# Patient Record
Sex: Female | Born: 1990 | Race: Black or African American | Hispanic: No | Marital: Single | Smoking: Never smoker
Health system: Southern US, Community
[De-identification: ages and names within clinical notes are randomized; demographics above are authoritative.]

## PROBLEM LIST (undated history)

## (undated) ENCOUNTER — Inpatient Hospital Stay (HOSPITAL_COMMUNITY): Payer: Self-pay

## (undated) DIAGNOSIS — O039 Complete or unspecified spontaneous abortion without complication: Secondary | ICD-10-CM

## (undated) HISTORY — PX: LEEP: SHX91

## (undated) HISTORY — PX: TONSILLECTOMY: SUR1361

## (undated) HISTORY — PX: DILATION AND CURETTAGE OF UTERUS: SHX78

---

## 2011-03-23 ENCOUNTER — Other Ambulatory Visit: Payer: Self-pay

## 2011-03-23 ENCOUNTER — Emergency Department (HOSPITAL_COMMUNITY)
Admission: EM | Admit: 2011-03-23 | Discharge: 2011-03-24 | Disposition: A | Discharge status: Home / Self Care | Payer: Self-pay | Attending: Emergency Medicine | Admitting: Emergency Medicine

## 2011-03-23 ENCOUNTER — Encounter (HOSPITAL_COMMUNITY): Payer: Self-pay | Admitting: Emergency Medicine

## 2011-03-23 DIAGNOSIS — M940 Chondrocostal junction syndrome [Tietze]: Secondary | ICD-10-CM | POA: Insufficient documentation

## 2011-03-23 DIAGNOSIS — R072 Precordial pain: Secondary | ICD-10-CM | POA: Insufficient documentation

## 2011-03-23 NOTE — ED Notes (Signed)
PT. REPORTS LEFT UPPER CHEST PAIN WITH SLIGHT SOB ONSET THIS EVENING ,PAIN WORSE WITH MOVEMENT AND CERTAIN POSITIONS , DENIES NAUSEA OR DIAPHORESIS.

## 2011-03-24 ENCOUNTER — Emergency Department (HOSPITAL_COMMUNITY): Payer: Self-pay

## 2011-03-24 MED ORDER — NAPROXEN SODIUM 220 MG PO CAPS: 2.0000 | ORAL_CAPSULE | Freq: Two times a day (BID) | ORAL | Status: DC | PRN

## 2011-03-24 MED ORDER — NAPROXEN 500 MG PO TABS
500.0000 mg | ORAL_TABLET | ORAL | Status: AC
  Administered 2011-03-24: 500 mg via ORAL
  Filled 2011-03-24: qty 1

## 2011-03-24 NOTE — ED Notes (Signed)
Pt c/o left sided chest and axilla pain that began around 11 pm this evening while lying in bed.  Denies any new physical activity or new stress in life.  Denies SOB, n/v.

## 2011-03-24 NOTE — ED Provider Notes (Signed)
History     CSN: [id number]  Arrival date & time 03/23/11  2346   First MD Initiated Contact with Patient 03/24/11 0103      Chief Complaint  Patient presents with  . Chest Pain    (Consider location/radiation/quality/duration/timing/severity/associated sxs/prior treatment) HPI Is a 21 year old black female with about a 2 hour history of left upper chest wall pain. The pain is located primarily in the left parasternal region but wraps around under the left axilla. It is worse with movement, palpation or deep breathing. She denies dyspnea stating that it only hurts to breathe. She denies recent illness, cough or chest injury. She states the pain is moderate to severe at its worst.  History reviewed. No pertinent past medical history.  History reviewed. No pertinent past surgical history.  No family history on file.  History  Substance Use Topics  . Smoking status: Never Smoker   . Smokeless tobacco: Not on file  . Alcohol Use: No    OB History    Grav Para Term Preterm Abortions TAB SAB Ect Mult Living                  Review of Systems  All other systems reviewed and are negative.    Allergies  Review of patient's allergies indicates no known allergies.  Home Medications  No current outpatient prescriptions on file.  BP 129/91  Pulse 85  Temp(Src) 98.5 F (36.9 C) (Oral)  Resp 18  SpO2 100%  LMP 03/06/2011  Physical Exam General: Well-developed, well-nourished female in no acute distress; appearance consistent with age of record HENT: normocephalic, atraumatic Eyes: Normal Neck: supple Heart: regular rate and rhythm Lungs: clear to auscultation bilaterally Chest: Moderate left parasternal, subpectoral and subaxillary rib tenderness; pain is reproduced on movement of the shoulder Abdomen: soft; nondistended Extremities: No deformity; full range of motion Neurologic: Awake, alert and oriented; motor function intact in all extremities and symmetric; no  facial droop Skin: Warm and dry Psychiatric: Normal mood and affect    ED Course  Procedures (including critical care time)    MDM  EKG Interpretation:  Date & Time: 03/24/2011 11:58 PM  Rate: 98  Rhythm: normal sinus rhythm  QRS Axis: normal  Intervals: normal  ST/T Wave abnormalities: normal  Conduction Disutrbances:none  Narrative Interpretation:   Old EKG Reviewed: none available  Nursing notes and vitals signs, including pulse oximetry, reviewed.  Summary of this visit's results, reviewed by myself:  Labs:  No results found for this or any previous visit.  Imaging Studies: Dg Chest 2 View  03/24/2011  *RADIOLOGY REPORT*  Clinical Data: Left chest and arm pain  CHEST - 2 VIEW  Comparison: None  Findings: Normal heart size, mediastinal contours, and pulmonary vascularity. Lungs clear. No pleural effusion or pneumothorax. Jewelry artifacts at the breast bilaterally. Mild biconvex thoracolumbar scoliosis.  IMPRESSION: No acute abnormalities.  Original Report Authenticated By: Lollie Marrow, M.D.               Hanley Seamen, MD 03/24/11 [phone]

## 2011-03-24 NOTE — ED Notes (Signed)
rx x 1, pt voiced understanding to f/u with PCP and return for worsening of sx. 

## 2012-06-27 ENCOUNTER — Ambulatory Visit: Payer: Self-pay | Admitting: Obstetrics

## 2012-07-25 ENCOUNTER — Ambulatory Visit: Payer: Self-pay | Admitting: Obstetrics

## 2013-02-12 IMAGING — CR DG CHEST 2V
2 series · 2 of 2 positions shown · non-contrast
Comparison: None

CLINICAL DATA: Left chest and arm pain

CHEST - 2 VIEW

[w chest pa *]
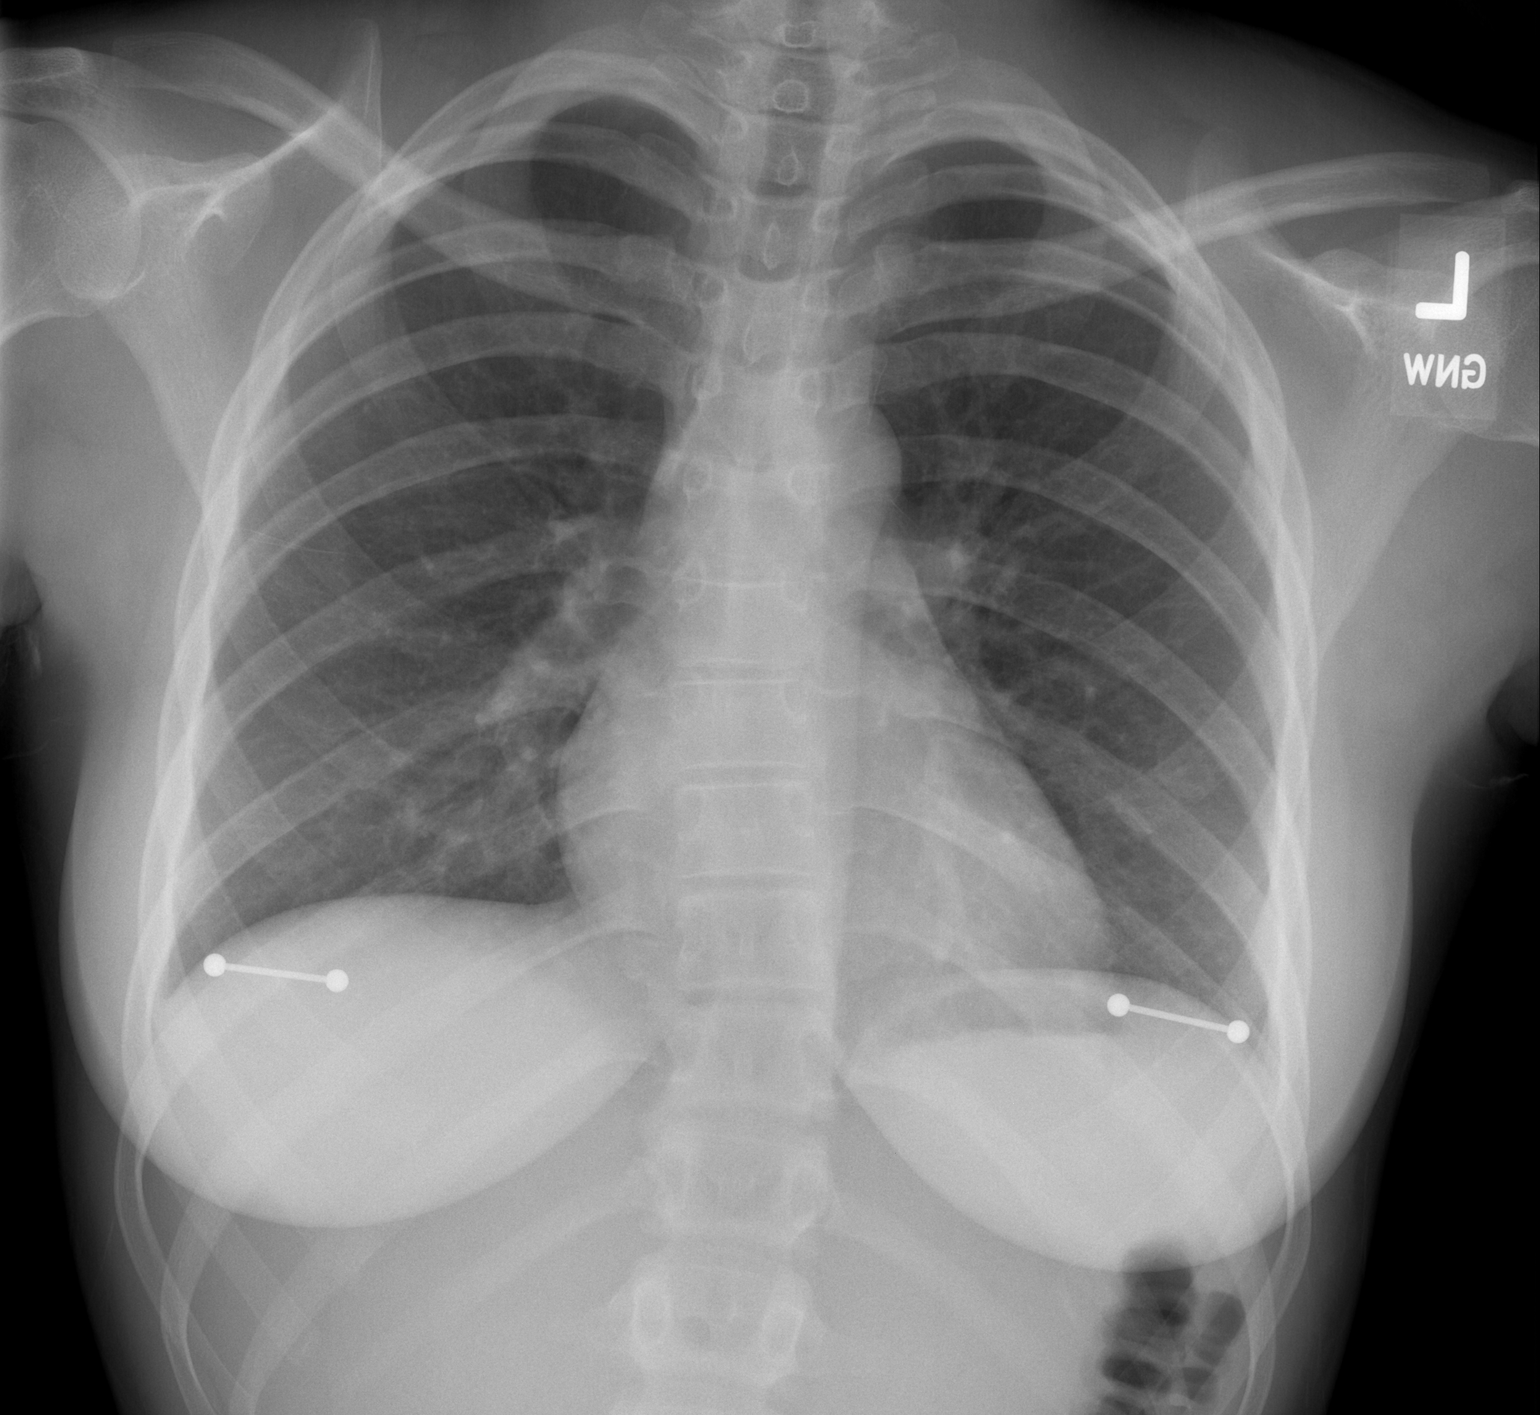

[w chest lat *]
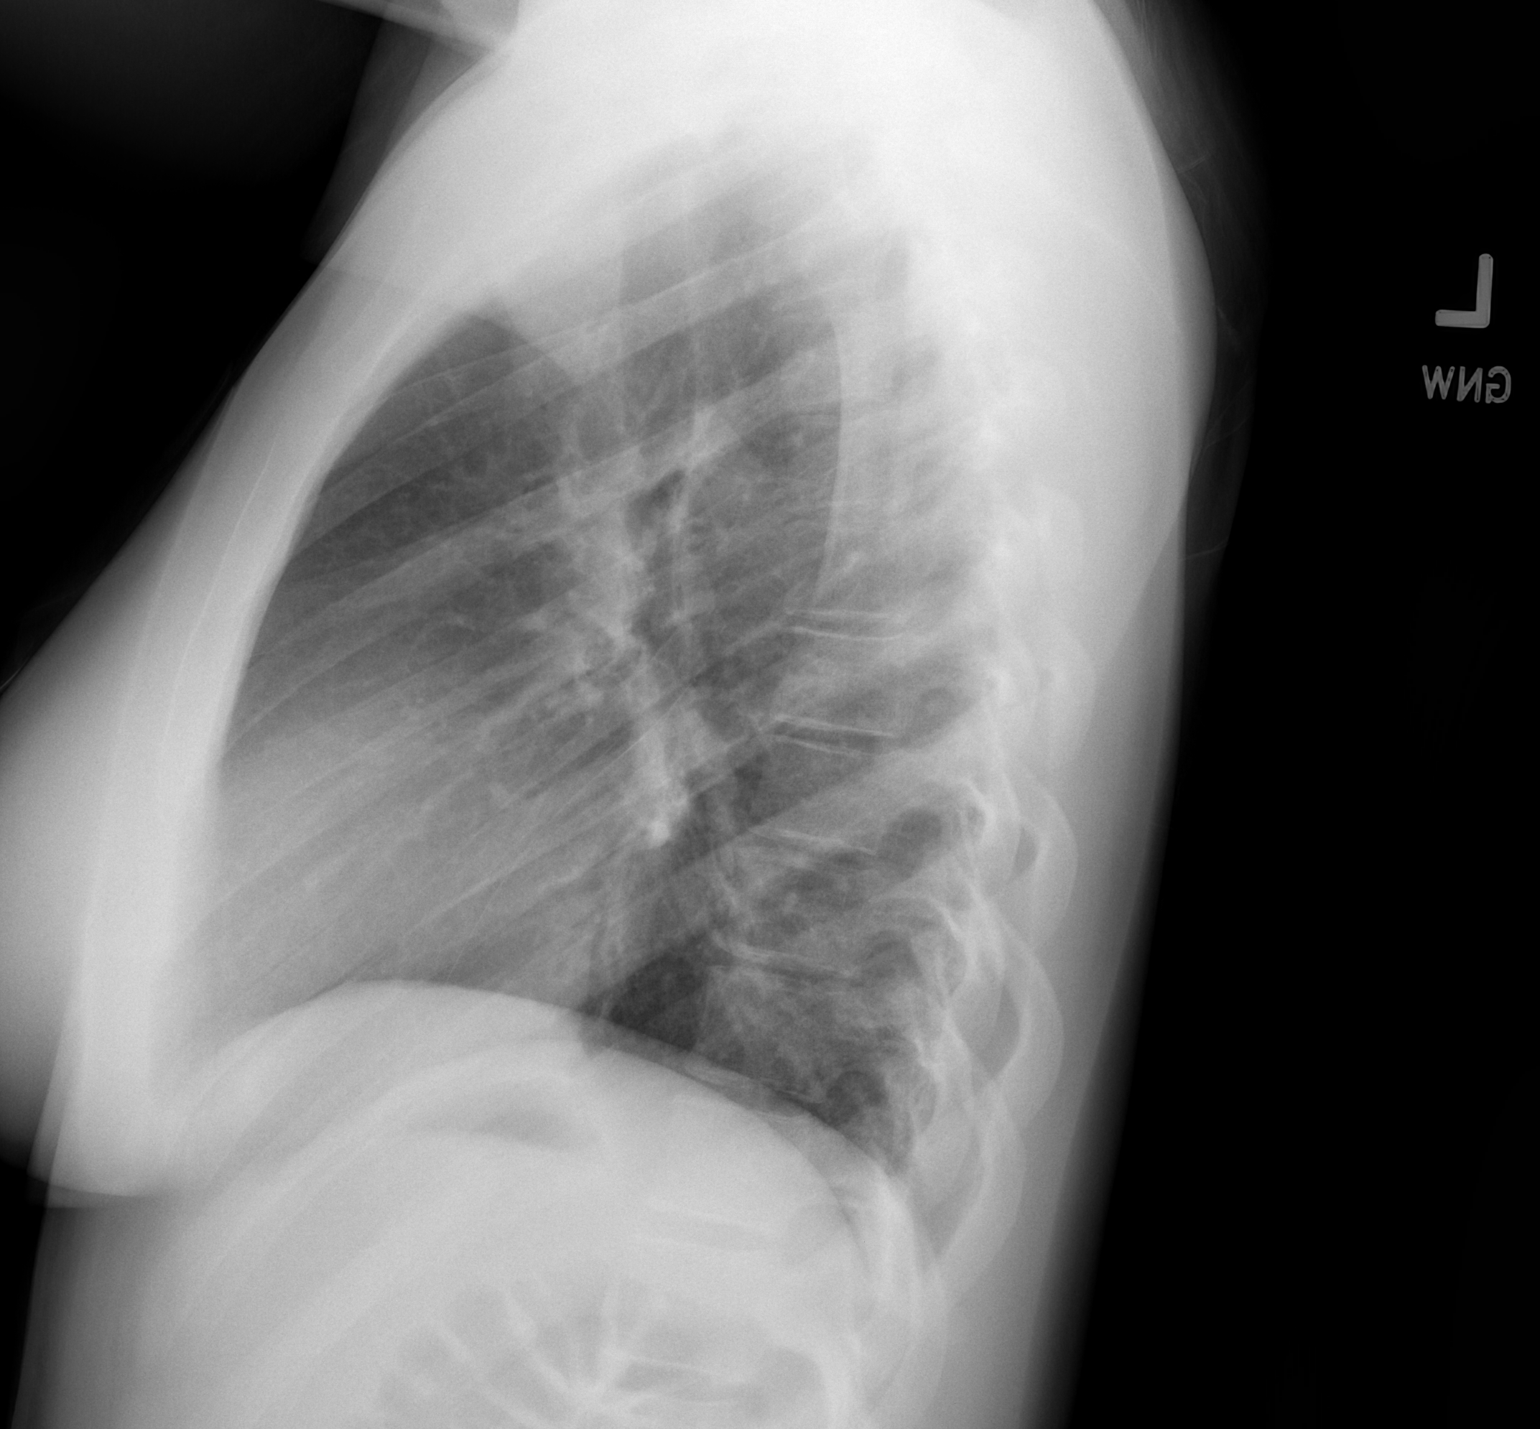

[2 of 2 positions shown; findings below may reference images not displayed]

FINDINGS: Normal heart size, mediastinal contours, and pulmonary vascularity.
Lungs clear.
No pleural effusion or pneumothorax.
Jewelry artifacts at the breast bilaterally.
Mild biconvex thoracolumbar scoliosis.
IMPRESSION: No acute abnormalities.

## 2014-09-18 ENCOUNTER — Emergency Department (HOSPITAL_COMMUNITY)
Admission: EM | Admit: 2014-09-18 | Discharge: 2014-09-18 | Disposition: A | Discharge status: Home / Self Care | Payer: BLUE CROSS/BLUE SHIELD | Attending: Emergency Medicine | Admitting: Emergency Medicine

## 2014-09-18 ENCOUNTER — Encounter (HOSPITAL_COMMUNITY): Payer: Self-pay | Admitting: *Deleted

## 2014-09-18 DIAGNOSIS — O9989 Other specified diseases and conditions complicating pregnancy, childbirth and the puerperium: Secondary | ICD-10-CM | POA: Diagnosis not present

## 2014-09-18 DIAGNOSIS — Z3491 Encounter for supervision of normal pregnancy, unspecified, first trimester: Secondary | ICD-10-CM

## 2014-09-18 DIAGNOSIS — R102 Pelvic and perineal pain: Secondary | ICD-10-CM | POA: Diagnosis not present

## 2014-09-18 DIAGNOSIS — Z3A09 9 weeks gestation of pregnancy: Secondary | ICD-10-CM | POA: Diagnosis not present

## 2014-09-18 DIAGNOSIS — N949 Unspecified condition associated with female genital organs and menstrual cycle: Secondary | ICD-10-CM

## 2014-09-18 LAB — URINALYSIS, ROUTINE W REFLEX MICROSCOPIC
BILIRUBIN URINE: NEGATIVE
GLUCOSE, UA: NEGATIVE mg/dL
Hgb urine dipstick: NEGATIVE
Ketones, ur: NEGATIVE mg/dL
Nitrite: NEGATIVE
PH: 7 (ref 5.0–8.0)
Protein, ur: NEGATIVE mg/dL
SPECIFIC GRAVITY, URINE: 1.015 (ref 1.005–1.030)
UROBILINOGEN UA: 0.2 mg/dL (ref 0.0–1.0)

## 2014-09-18 LAB — COMPREHENSIVE METABOLIC PANEL
ALT: 17 U/L (ref 14–54)
AST: 21 U/L (ref 15–41)
Albumin: 3.9 g/dL (ref 3.5–5.0)
Alkaline Phosphatase: 27 U/L — ABNORMAL LOW (ref 38–126)
Anion gap: 7 (ref 5–15)
BILIRUBIN TOTAL: 0.5 mg/dL (ref 0.3–1.2)
BUN: 8 mg/dL (ref 6–20)
CALCIUM: 9 mg/dL (ref 8.9–10.3)
CO2: 22 mmol/L (ref 22–32)
CREATININE: 0.53 mg/dL (ref 0.44–1.00)
Chloride: 102 mmol/L (ref 101–111)
Glucose, Bld: 92 mg/dL (ref 65–99)
Potassium: 3.7 mmol/L (ref 3.5–5.1)
Sodium: 131 mmol/L — ABNORMAL LOW (ref 135–145)
TOTAL PROTEIN: 6.5 g/dL (ref 6.5–8.1)

## 2014-09-18 LAB — CBC
HCT: 34.2 % — ABNORMAL LOW (ref 36.0–46.0)
HEMOGLOBIN: 11.9 g/dL — AB (ref 12.0–15.0)
MCH: 28.9 pg (ref 26.0–34.0)
MCHC: 34.8 g/dL (ref 30.0–36.0)
MCV: 83 fL (ref 78.0–100.0)
Platelets: 249 10*3/uL (ref 150–400)
RBC: 4.12 MIL/uL (ref 3.87–5.11)
RDW: 12.9 % (ref 11.5–15.5)
WBC: 8.4 10*3/uL (ref 4.0–10.5)

## 2014-09-18 LAB — WET PREP, GENITAL
Trich, Wet Prep: NONE SEEN
Yeast Wet Prep HPF POC: NONE SEEN

## 2014-09-18 LAB — URINE MICROSCOPIC-ADD ON

## 2014-09-18 LAB — ABO/RH: ABO/RH(D): B POS

## 2014-09-18 LAB — GC/CHLAMYDIA PROBE AMP (~~LOC~~) NOT AT ARMC
CHLAMYDIA, DNA PROBE: POSITIVE — AB
NEISSERIA GONORRHEA: NEGATIVE

## 2014-09-18 LAB — HIV ANTIBODY (ROUTINE TESTING W REFLEX): HIV SCREEN 4TH GENERATION: NONREACTIVE

## 2014-09-18 LAB — I-STAT BETA HCG BLOOD, ED (MC, WL, AP ONLY): I-stat hCG, quantitative: >2000 m[IU]/mL — ABNORMAL HIGH (ref <5)

## 2014-09-18 LAB — HCG, QUANTITATIVE, PREGNANCY: HCG, BETA CHAIN, QUANT, S: 127066 m[IU]/mL — AB (ref <5)

## 2014-09-18 LAB — LIPASE, BLOOD: LIPASE: 15 U/L — AB (ref 22–51)

## 2014-09-18 LAB — RPR: RPR Ser Ql: NONREACTIVE

## 2014-09-18 MED ORDER — ACETAMINOPHEN 325 MG PO TABS
650.0000 mg | ORAL_TABLET | Freq: Once | ORAL | Status: AC
  Administered 2014-09-18: 650 mg via ORAL
  Filled 2014-09-18: qty 2

## 2014-09-18 MED ORDER — PRENATAL COMPLETE 14-0.4 MG PO TABS: 1.0000 | ORAL_TABLET | Freq: Every day | ORAL | Status: DC

## 2014-09-18 NOTE — ED Provider Notes (Signed)
CSN: [id number]     Arrival date & time 09/18/14  0238 History   First MD Initiated Contact with Patient 09/18/14 0435     Chief Complaint  Patient presents with  . Abdominal Pain     (Consider location/radiation/quality/duration/timing/severity/associated sxs/prior Treatment) Patient is a 24 y.o. female presenting with abdominal pain. The history is provided by the patient.  Abdominal Pain She is about [redacted] weeks pregnant with last menses 8/23. She was awakened at 1 AM with crampy suprapubic pain which he rates at 7/10. It is worse with certain movements and better she lays still. She has not started prenatal care and is not on any prenatal vitamins. She is gravida 3, para 1 with 2 voluntary since her pregnancy. She has made arrangements for prenatal care. There is been no bleeding and no vaginal discharge. She has not taken any medication for her pain.  History reviewed. No pertinent past medical history. History reviewed. No pertinent past surgical history. History reviewed. No pertinent family history. History  Substance Use Topics  . Smoking status: Never Smoker   . Smokeless tobacco: Not on file  . Alcohol Use: No   OB History    Gravida Para Term Preterm AB TAB SAB Ectopic Multiple Living   1              Review of Systems  Gastrointestinal: Positive for abdominal pain.  All other systems reviewed and are negative.     Allergies  Review of patient's allergies indicates no known allergies.  Home Medications   Prior to Admission medications   Medication Sig Start Date End Date Taking? Authorizing Provider  Naproxen Sodium 220 MG CAPS Take 2 capsules (440 mg total) by mouth every 12 (twelve) hours as needed. 03/24/11   John Molpus, MD   BP 137/82 mmHg  Pulse 102  Temp(Src) 98.2 F (36.8 C) (Oral)  Resp 16  Ht  (1.549 m)  Wt 108 lb 4.8 oz (49.125 kg)  BMI 20.47 kg/m2  SpO2 99%  LMP 07/13/2014 Physical Exam  Nursing note and vitals reviewed.  24 year old  female, resting comfortably and in no acute distress. Vital signs are significant for borderline tachycardia. Oxygen saturation is 99%, which is normal. Head is normocephalic and atraumatic. PERRLA, EOMI. Oropharynx is clear. Neck is nontender and supple without adenopathy or JVD. Back is nontender and there is no CVA tenderness. Lungs are clear without rales, wheezes, or rhonchi. Chest is nontender. Heart has regular rate and rhythm without murmur. Abdomen is soft, flat, with moderate midline suprapubic tenderness. There is no rebound or guarding. There are no masses or hepatosplenomegaly and peristalsis is normoactive. Pelvic: Normal external female genitalia. Moderate amount of yellowish discharge present. Cervix is closed without bleeding. This is a to 10 weeks size. Pain is reproduced when uterus is distracted to either side consistent with round ligament pain. No adnexal masses or tenderness. Extremities have no cyanosis or edema, full range of motion is present. Skin is warm and dry without rash. Neurologic: Mental status is normal, cranial nerves are intact, there are no motor or sensory deficits.  ED Course  Procedures (including critical care time) Labs Review Results for orders placed or performed during the hospital encounter of 09/18/14  Wet prep, genital  Result Value Ref Range   Yeast Wet Prep HPF POC NONE SEEN NONE SEEN   Trich, Wet Prep NONE SEEN NONE SEEN   Clue Cells Wet Prep HPF POC MODERATE (A) NONE SEEN  WBC, Wet Prep HPF POC FEW (A) NONE SEEN  Lipase, blood  Result Value Ref Range   Lipase 15 (L) 22 - 51 U/L  Comprehensive metabolic panel  Result Value Ref Range   Sodium 131 (L) 135 - 145 mmol/L   Potassium 3.7 3.5 - 5.1 mmol/L   Chloride 102 101 - 111 mmol/L   CO2 22 22 - 32 mmol/L   Glucose, Bld 92 65 - 99 mg/dL   BUN 8 6 - 20 mg/dL   Creatinine, Ser 9.14 0.44 - 1.00 mg/dL   Calcium 9.0 8.9 - 78.2 mg/dL   Total Protein 6.5 6.5 - 8.1 g/dL   Albumin 3.9  3.5 - 5.0 g/dL   AST 21 15 - 41 U/L   ALT 17 14 - 54 U/L   Alkaline Phosphatase 27 (L) 38 - 126 U/L   Total Bilirubin 0.5 0.3 - 1.2 mg/dL   GFR calc non Af Amer >60 >60 mL/min   GFR calc Af Amer >60 >60 mL/min   Anion gap 7 5 - 15  CBC  Result Value Ref Range   WBC 8.4 4.0 - 10.5 K/uL   RBC 4.12 3.87 - 5.11 MIL/uL   Hemoglobin 11.9 (L) 12.0 - 15.0 g/dL   HCT 95.6 (L) 21.3 - 08.6 %   MCV 83.0 78.0 - 100.0 fL   MCH 28.9 26.0 - 34.0 pg   MCHC 34.8 30.0 - 36.0 g/dL   RDW 57.8 46.9 - 62.9 %   Platelets 249 150 - 400 K/uL  Urinalysis, Routine w reflex microscopic (not at [facility])  Result Value Ref Range   Color, Urine YELLOW YELLOW   APPearance CLEAR CLEAR   Specific Gravity, Urine 1.015 1.005 - 1.030   pH 7.0 5.0 - 8.0   Glucose, UA NEGATIVE NEGATIVE mg/dL   Hgb urine dipstick NEGATIVE NEGATIVE   Bilirubin Urine NEGATIVE NEGATIVE   Ketones, ur NEGATIVE NEGATIVE mg/dL   Protein, ur NEGATIVE NEGATIVE mg/dL   Urobilinogen, UA 0.2 0.0 - 1.0 mg/dL   Nitrite NEGATIVE NEGATIVE   Leukocytes, UA TRACE (A) NEGATIVE  Urine microscopic-add on  Result Value Ref Range   Squamous Epithelial / LPF FEW (A) RARE   WBC, UA 3-6 <3 WBC/hpf   Bacteria, UA FEW (A) RARE  hCG, quantitative, pregnancy  Result Value Ref Range   hCG, Beta Chain, Quant, S 528413 (H) <5 mIU/mL  I-Stat beta hCG blood, ED ([facility], [facility], [facility] only)  Result Value Ref Range   I-stat hCG, quantitative >2000.0 (H) <5 mIU/mL   Comment 3          ABO/Rh  Result Value Ref Range   ABO/RH(D) B POS    No rh immune globuloin NOT A RH IMMUNE GLOBULIN CANDIDATE, PT RH POSITIVE     MDM   Final diagnoses:  Round ligament pain  First trimester pregnancy    First trimester pregnancy with pelvic pain - most likely round ligament pain. Old records are reviewed and there are no relevant past visits.  Laboratory workup is unremarkable. Patient is given reassurance about the nature of her pain. She is not on prenatal vitamins, so prescription  is given for them. She is encouraged to start prenatal care as soon as possible.  Dione Booze, MD 09/18/14 [phone]

## 2014-09-18 NOTE — Discharge Instructions (Signed)
Apply heat, take acetaminophen for pain. Do not take ibuprofen or ibuprofen while you are pregnant.   First Trimester of Pregnancy The first trimester of pregnancy is from week 1 until the end of week 12 (months 1 through 3). A week after a sperm fertilizes an egg, the egg will implant on the wall of the uterus. This embryo will begin to develop into a baby. Genes from you and your partner are forming the baby. The female genes determine whether the baby is a boy or a girl. At 6-8 weeks, the eyes and face are formed, and the heartbeat can be seen on ultrasound. At the end of 12 weeks, all the baby's organs are formed.  Now that you are pregnant, you will want to do everything you can to have a healthy baby. Two of the most important things are to get good prenatal care and to follow your health care provider's instructions. Prenatal care is all the medical care you receive before the baby's birth. This care will help prevent, find, and treat any problems during the pregnancy and childbirth. BODY CHANGES Your body goes through many changes during pregnancy. The changes vary from woman to woman.   You may gain or lose a couple of pounds at first.  You may feel sick to your stomach (nauseous) and throw up (vomit). If the vomiting is uncontrollable, call your health care provider.  You may tire easily.  You may develop headaches that can be relieved by medicines approved by your health care provider.  You may urinate more often. Painful urination may mean you have a bladder infection.  You may develop heartburn as a result of your pregnancy.  You may develop constipation because certain hormones are causing the muscles that push waste through your intestines to slow down.  You may develop hemorrhoids or swollen, bulging veins (varicose veins).  Your breasts may begin to grow larger and become tender. Your nipples may stick out more, and the tissue that surrounds them (areola) may become  darker.  Your gums may bleed and may be sensitive to brushing and flossing.  Dark spots or blotches (chloasma, mask of pregnancy) may develop on your face. This will likely fade after the baby is born.  Your menstrual periods will stop.  You may have a loss of appetite.  You may develop cravings for certain kinds of food.  You may have changes in your emotions from day to day, such as being excited to be pregnant or being concerned that something may go wrong with the pregnancy and baby.  You may have more vivid and strange dreams.  You may have changes in your hair. These can include thickening of your hair, rapid growth, and changes in texture. Some women also have hair loss during or after pregnancy, or hair that feels dry or thin. Your hair will most likely return to normal after your baby is born. WHAT TO EXPECT AT YOUR PRENATAL VISITS During a routine prenatal visit:  You will be weighed to make sure you and the baby are growing normally.  Your blood pressure will be taken.  Your abdomen will be measured to track your baby's growth.  The fetal heartbeat will be listened to starting around week 10 or 12 of your pregnancy.  Test results from any previous visits will be discussed. Your health care provider may ask you:  How you are feeling.  If you are feeling the baby move.  If you have had any abnormal symptoms,  such as leaking fluid, bleeding, severe headaches, or abdominal cramping.  If you have any questions. Other tests that may be performed during your first trimester include:  Blood tests to find your blood type and to check for the presence of any previous infections. They will also be used to check for low iron levels (anemia) and Rh antibodies. Later in the pregnancy, blood tests for diabetes will be done along with other tests if problems develop.  Urine tests to check for infections, diabetes, or protein in the urine.  An ultrasound to confirm the proper  growth and development of the baby.  An amniocentesis to check for possible genetic problems.  Fetal screens for spina bifida and Down syndrome.  You may need other tests to make sure you and the baby are doing well. HOME CARE INSTRUCTIONS  Medicines  Follow your health care provider's instructions regarding medicine use. Specific medicines may be either safe or unsafe to take during pregnancy.  Take your prenatal vitamins as directed.  If you develop constipation, try taking a stool softener if your health care provider approves. Diet  Eat regular, well-balanced meals. Choose a variety of foods, such as meat or vegetable-based protein, fish, milk and low-fat dairy products, vegetables, fruits, and whole grain breads and cereals. Your health care provider will help you determine the amount of weight gain that is right for you.  Avoid raw meat and uncooked cheese. These carry germs that can cause birth defects in the baby.  Eating four or five small meals rather than three large meals a day may help relieve nausea and vomiting. If you start to feel nauseous, eating a few soda crackers can be helpful. Drinking liquids between meals instead of during meals also seems to help nausea and vomiting.  If you develop constipation, eat more high-fiber foods, such as fresh vegetables or fruit and whole grains. Drink enough fluids to keep your urine clear or pale yellow. Activity and Exercise  Exercise only as directed by your health care provider. Exercising will help you:  Control your weight.  Stay in shape.  Be prepared for labor and delivery.  Experiencing pain or cramping in the lower abdomen or low back is a good sign that you should stop exercising. Check with your health care provider before continuing normal exercises.  Try to avoid standing for long periods of time. Move your legs often if you must stand in one place for a long time.  Avoid heavy lifting.  Wear low-heeled  shoes, and practice good posture.  You may continue to have sex unless your health care provider directs you otherwise. Relief of Pain or Discomfort  Wear a good support bra for breast tenderness.   Take warm sitz baths to soothe any pain or discomfort caused by hemorrhoids. Use hemorrhoid cream if your health care provider approves.   Rest with your legs elevated if you have leg cramps or low back pain.  If you develop varicose veins in your legs, wear support hose. Elevate your feet for 15 minutes, 3-4 times a day. Limit salt in your diet. Prenatal Care  Schedule your prenatal visits by the twelfth week of pregnancy. They are usually scheduled monthly at first, then more often in the last 2 months before delivery.  Write down your questions. Take them to your prenatal visits.  Keep all your prenatal visits as directed by your health care provider. Safety  Wear your seat belt at all times when driving.  Make a list  of emergency phone numbers, including numbers for family, friends, the hospital, and police and fire departments. General Tips  Ask your health care provider for a referral to a local prenatal education class. Begin classes no later than at the beginning of month 6 of your pregnancy.  Ask for help if you have counseling or nutritional needs during pregnancy. Your health care provider can offer advice or refer you to specialists for help with various needs.  Do not use hot tubs, steam rooms, or saunas.  Do not douche or use tampons or scented sanitary pads.  Do not cross your legs for long periods of time.  Avoid cat litter boxes and soil used by cats. These carry germs that can cause birth defects in the baby and possibly loss of the fetus by miscarriage or stillbirth.  Avoid all smoking, herbs, alcohol, and medicines not prescribed by your health care provider. Chemicals in these affect the formation and growth of the baby.  Schedule a dentist appointment. At  home, brush your teeth with a soft toothbrush and be gentle when you floss. SEEK MEDICAL CARE IF:   You have dizziness.  You have mild pelvic cramps, pelvic pressure, or nagging pain in the abdominal area.  You have persistent nausea, vomiting, or diarrhea.  You have a bad smelling vaginal discharge.  You have pain with urination.  You notice increased swelling in your face, hands, legs, or ankles. SEEK IMMEDIATE MEDICAL CARE IF:   You have a fever.  You are leaking fluid from your vagina.  You have spotting or bleeding from your vagina.  You have severe abdominal cramping or pain.  You have rapid weight gain or loss.  You vomit blood or material that looks like coffee grounds.  You are exposed to [place] measles and have never had them.  You are exposed to fifth disease or chickenpox.  You develop a severe headache.  You have shortness of breath.  You have any kind of trauma, such as from a fall or a car accident. Document Released: 01/31/2001 Document Revised: 06/23/2013 Document Reviewed: 12/17/2012 [facility] Patient Information 2015 [place], [place]. This information is not intended to replace advice given to you by your health care provider. Make sure you discuss any questions you have with your health care provider.

## 2014-09-18 NOTE — ED Notes (Signed)
Pelvic cart setup bedside. 

## 2014-09-18 NOTE — ED Notes (Signed)
Pt in c/o lower abd pain that started about two hours ago, pt states she is approx [redacted] weeks pregnant, denies vaginal bleeding, no distress noted

## 2014-09-21 ENCOUNTER — Telehealth (HOSPITAL_COMMUNITY): Payer: Self-pay

## 2014-09-21 NOTE — Telephone Encounter (Signed)
Positive for chlamydia. Chart sent to edp office for review 

## 2014-09-23 ENCOUNTER — Telehealth (HOSPITAL_COMMUNITY): Payer: Self-pay

## 2014-09-23 NOTE — Telephone Encounter (Signed)
Chart reviewed by Dr Silverio Lay "Azithromycin 1 gm po x 1"  Partner needs treatment 09/23/14  13:44 LVM requesting callback.

## 2014-09-24 ENCOUNTER — Telehealth (HOSPITAL_COMMUNITY): Payer: Self-pay

## 2014-09-26 ENCOUNTER — Telehealth (HOSPITAL_COMMUNITY): Payer: Self-pay | Admitting: Emergency Medicine

## 2014-09-26 NOTE — Telephone Encounter (Signed)
Unable to contact patient by phone regarding lab results after multiple attempts, letter sent.

## 2014-09-26 NOTE — Telephone Encounter (Signed)
Post ED Visit - Positive Culture Follow-up: Successful Patient Follow-Up  Positive Chlamydia culture   Patient discharged without antimicrobial prescription and treatment is now indicated  Organism is resistant to prescribed ED discharge antimicrobial  Patient with positive blood cultures  Changes discussed with ED provider: Dr Chaney Malling New antibiotic prescription: Azithromycin one gram PO x once Called to [facility]  [phone]  Contacted patient, date 09/26/14, time 1640 ID verified, patient notified of positive Chlamydia and need for treatment. STD instructions provided, patient verbalized understanding. RX called to [facility] [phone]  Monica Shields 09/26/2014, 4:42 PM

## 2016-03-25 ENCOUNTER — Encounter (HOSPITAL_COMMUNITY): Payer: Self-pay

## 2016-03-25 ENCOUNTER — Emergency Department (HOSPITAL_COMMUNITY): Payer: Medicaid Other

## 2016-03-25 ENCOUNTER — Emergency Department (HOSPITAL_COMMUNITY)
Admission: EM | Admit: 2016-03-25 | Discharge: 2016-03-26 | Disposition: A | Discharge status: Home / Self Care | Payer: Medicaid Other | Attending: Emergency Medicine | Admitting: Emergency Medicine

## 2016-03-25 DIAGNOSIS — O200 Threatened abortion: Secondary | ICD-10-CM

## 2016-03-25 DIAGNOSIS — Z3A01 Less than 8 weeks gestation of pregnancy: Secondary | ICD-10-CM | POA: Diagnosis not present

## 2016-03-25 DIAGNOSIS — O209 Hemorrhage in early pregnancy, unspecified: Secondary | ICD-10-CM | POA: Diagnosis present

## 2016-03-25 DIAGNOSIS — N939 Abnormal uterine and vaginal bleeding, unspecified: Secondary | ICD-10-CM

## 2016-03-25 LAB — URINALYSIS, ROUTINE W REFLEX MICROSCOPIC
BILIRUBIN URINE: NEGATIVE
Glucose, UA: NEGATIVE mg/dL
HGB URINE DIPSTICK: NEGATIVE
Ketones, ur: 5 mg/dL — AB
Leukocytes, UA: NEGATIVE
NITRITE: NEGATIVE
PH: 5 (ref 5.0–8.0)
Protein, ur: NEGATIVE mg/dL
SPECIFIC GRAVITY, URINE: 1.025 (ref 1.005–1.030)

## 2016-03-25 LAB — I-STAT BETA HCG BLOOD, ED (MC, WL, AP ONLY): I-stat hCG, quantitative: >2000 m[IU]/mL — ABNORMAL HIGH (ref <5)

## 2016-03-25 LAB — WET PREP, GENITAL
Sperm: NONE SEEN
Trich, Wet Prep: NONE SEEN
Yeast Wet Prep HPF POC: NONE SEEN

## 2016-03-25 LAB — HCG, QUANTITATIVE, PREGNANCY: HCG, BETA CHAIN, QUANT, S: 16356 m[IU]/mL — AB (ref <5)

## 2016-03-25 NOTE — ED Notes (Signed)
Pt understood dc material. NAD noted. 

## 2016-03-25 NOTE — ED Triage Notes (Signed)
Estimated [redacted] weeks pregnant.  Pt had u/s done 03-21-16.  Last intercourse 2 days ago.  Onset yesterday light pink blood noted when wiping.  Has wiped x 10 today.  No abd cramping.

## 2016-03-25 NOTE — ED Provider Notes (Signed)
[facility]-EMERGENCY DEPT Provider Note   CSN: [id number] Arrival date & time: 03/25/16  1854     History   Chief Complaint Chief Complaint  Patient presents with  . Vaginal Bleeding    HPI Monica Shields is a 26 y.o. female.  The history is provided by the patient. No language interpreter was used.  Vaginal Bleeding  Primary symptoms include vaginal bleeding.    Monica Shields is a 26 y.o. female who presents to the Emergency Department complaining of vaginal bleeding.  She reports spotting since yesterday, initially light pink and now a little more red.  Bleeding present when she wipes and after using the bathroom.  No pain.  Denies fevers, N/V/dysuria.  She denies any medical problems.  First day of LMP 01/15/16.  She saw the doctor on Tuesday and was measured at 6 wks with planned follow up US in one week.  She is followed by [facility] in [place].  She has a hx/o prior miscarraige at 8-9 wks.  On record review her blood type is Bpos  History reviewed. No pertinent past medical history.  There are no active problems to display for this patient.   History reviewed. No pertinent surgical history.  OB History    Gravida Para Term Preterm AB Living   1             SAB TAB Ectopic Multiple Live Births                   Home Medications    Prior to Admission medications   Medication Sig Start Date End Date Taking? Authorizing Provider  Naproxen Sodium 220 MG CAPS Take 2 capsules (440 mg total) by mouth every 12 (twelve) hours as needed. Patient not taking: Reported on 09/18/2014 03/24/11   Paula Libra, MD  Prenatal Vit-Fe Fumarate-FA (PRENATAL COMPLETE) 14-0.4 MG TABS Take 1 tablet by mouth daily. 09/18/14   Dione Booze, MD    Family History History reviewed. No pertinent family history.  Social History Social History  Substance Use Topics  . Smoking status: Never Smoker  . Smokeless tobacco: Never Used  . Alcohol use No     Allergies   Patient has no  known allergies.   Review of Systems Review of Systems  Genitourinary: Positive for vaginal bleeding.  All other systems reviewed and are negative.    Physical Exam Updated Vital Signs BP 116/72 (BP Location: Right Arm)   Pulse 95   Temp 98.7 F (37.1 C) (Oral)   Resp 16   LMP 01/15/2016 Comment: LMP date is not correct, pt reports u/s done week of 1-30 and was told [redacted] weeks pregnant.     SpO2 100%   Physical Exam  Constitutional: She is oriented to person, place, and time. She appears well-developed and well-nourished.  HENT:  Head: Normocephalic and atraumatic.  Cardiovascular: Normal rate and regular rhythm.   No murmur heard. Pulmonary/Chest: Effort normal and breath sounds normal. No respiratory distress.  Abdominal: Soft. There is no tenderness. There is no rebound and no guarding.  Genitourinary:  Genitourinary Comments: Os open with scant vaginal bleeding.  No CMT or adnexal tenderness  Musculoskeletal: She exhibits no edema or tenderness.  Neurological: She is alert and oriented to person, place, and time.  Skin: Skin is warm and dry.  Psychiatric: She has a normal mood and affect. Her behavior is normal.  Nursing note and vitals reviewed.    ED Treatments / Results  Labs (all  labs ordered are listed, but only abnormal results are displayed) Labs Reviewed  WET PREP, GENITAL - Abnormal; Notable for the following:       Result Value   Clue Cells Wet Prep HPF POC PRESENT (*)    WBC, Wet Prep HPF POC FEW (*)    All other components within normal limits  HCG, QUANTITATIVE, PREGNANCY - Abnormal; Notable for the following:    hCG, Beta Chain, Quant, S 16,356 (*)    All other components within normal limits  URINALYSIS, ROUTINE W REFLEX MICROSCOPIC - Abnormal; Notable for the following:    APPearance HAZY (*)    Ketones, ur 5 (*)    All other components within normal limits  I-STAT BETA HCG BLOOD, ED ([facility], [facility], [facility] ONLY) - Abnormal; Notable for the following:     I-stat hCG, quantitative >2,000.0 (*)    All other components within normal limits  GC/CHLAMYDIA PROBE AMP ([place]) NOT AT [facility]    EKG  EKG Interpretation None       Radiology US Ob Comp < 14 Wks  Result Date: 03/25/2016 CLINICAL DATA:  Bleeding for 1 day. EXAM: OBSTETRIC <14 WK [place] AND TRANSVAGINAL OB US TECHNIQUE: Both transabdominal and transvaginal ultrasound examinations were performed for complete evaluation of the gestation as well as the maternal uterus, adnexal regions, and pelvic cul-de-sac. Transvaginal technique was performed to assess early pregnancy. COMPARISON:  None. FINDINGS: Intrauterine gestational sac: Single Yolk sac:  Present Embryo:  Not seen Cardiac Activity: Not seen Heart Rate: Not applicable MSD: 25  mm   7 w   4  d CRL:    mm    w    d                  [place] EDC: Subchorionic hemorrhage:  None visualized. Maternal uterus/adnexae: Maternal right ovary appears normal. Left ovary is not seen but there is no mass or free fluid identified in the left adnexal region. IMPRESSION: Single intrauterine gestational sac. No embryo identified and there is complex fluid within the gestational sac, highly suspicious for pregnancy failure. Recommend follow-up with serial beta HCG levels and pelvic ultrasound as needed. Electronically Signed   By: Bary Richard M.D.   On: 03/25/2016 22:20   US Ob Transvaginal  Result Date: 03/25/2016 CLINICAL DATA:  Bleeding for 1 day. EXAM: OBSTETRIC <14 WK [place] AND TRANSVAGINAL OB US TECHNIQUE: Both transabdominal and transvaginal ultrasound examinations were performed for complete evaluation of the gestation as well as the maternal uterus, adnexal regions, and pelvic cul-de-sac. Transvaginal technique was performed to assess early pregnancy. COMPARISON:  None. FINDINGS: Intrauterine gestational sac: Single Yolk sac:  Present Embryo:  Not seen Cardiac Activity: Not seen Heart Rate: Not applicable MSD: 25  mm   7 w   4  d CRL:    mm    w    d                   [place] EDC: Subchorionic hemorrhage:  None visualized. Maternal uterus/adnexae: Maternal right ovary appears normal. Left ovary is not seen but there is no mass or free fluid identified in the left adnexal region. IMPRESSION: Single intrauterine gestational sac. No embryo identified and there is complex fluid within the gestational sac, highly suspicious for pregnancy failure. Recommend follow-up with serial beta HCG levels and pelvic ultrasound as needed. Electronically Signed   By: Bary Richard M.D.   On: 03/25/2016 22:20    Procedures  Procedures (including critical care time)  Medications Ordered in ED Medications - No data to display   Initial Impression / Assessment and Plan / ED Course  I have reviewed the triage vital signs and the nursing notes.  Pertinent labs & imaging results that were available during my care of the patient were reviewed by me and considered in my medical decision making (see chart for details).     Pt here with vaginal bleeding, ultrasound from four days ago with 6wk pregnancy but some abnormalities.  She is in no distress in ED with scant bleeding.  US today concerning for failed pregnancy and os is open on exam.  D/w pt concerns for impending miscarriage.  Discussed home care, OBGYN follow up as well as return precautions.    Final Clinical Impressions(s) / ED Diagnoses   Final diagnoses:  Vaginal bleeding  Threatened miscarriage    New Prescriptions Discharge Medication List as of 03/25/2016 10:44 PM       Tilden FossaElizabeth Malgorzata Albert, MD 03/26/16 1154

## 2016-03-25 NOTE — Discharge Instructions (Signed)
Your ultrasound was abnormal today and is concerning for an abnormal pregnancy.  Your pregnancy hormone was 16,356 today.  Please follow up with your OBGYN for recheck.    CLINICAL DATA:  Bleeding for 1 day.   EXAM: OBSTETRIC <14 WK US AND TRANSVAGINAL OB US   TECHNIQUE: Both transabdominal and transvaginal ultrasound examinations were performed for complete evaluation of the gestation as well as the maternal uterus, adnexal regions, and pelvic cul-de-sac. Transvaginal technique was performed to assess early pregnancy.   COMPARISON:  None.   FINDINGS: Intrauterine gestational sac: Single   Yolk sac:  Present   Embryo:  Not seen   Cardiac Activity: Not seen   Heart Rate: Not applicable   MSD: 25  mm   7 w   4  d   CRL:    mm    w    d                  US EDC:   Subchorionic hemorrhage:  None visualized.   Maternal uterus/adnexae: Maternal right ovary appears normal. Left ovary is not seen but there is no mass or free fluid identified in the left adnexal region.   IMPRESSION: Single intrauterine gestational sac. No embryo identified and there is complex fluid within the gestational sac, highly suspicious for pregnancy failure. Recommend follow-up with serial beta HCG levels and pelvic ultrasound as needed.

## 2016-03-27 LAB — GC/CHLAMYDIA PROBE AMP (~~LOC~~) NOT AT ARMC
CHLAMYDIA, DNA PROBE: POSITIVE — AB
Neisseria Gonorrhea: NEGATIVE

## 2016-03-31 ENCOUNTER — Encounter (HOSPITAL_COMMUNITY): Payer: Self-pay | Admitting: Certified Nurse Midwife

## 2016-03-31 ENCOUNTER — Inpatient Hospital Stay (HOSPITAL_COMMUNITY)
Admission: AD | Admit: 2016-03-31 | Discharge: 2016-04-01 | Disposition: A | Discharge status: Home / Self Care | Payer: Medicaid Other | Source: Ambulatory Visit | Attending: Obstetrics and Gynecology | Admitting: Obstetrics and Gynecology

## 2016-03-31 DIAGNOSIS — O034 Incomplete spontaneous abortion without complication: Secondary | ICD-10-CM | POA: Insufficient documentation

## 2016-03-31 DIAGNOSIS — Z79899 Other long term (current) drug therapy: Secondary | ICD-10-CM | POA: Insufficient documentation

## 2016-03-31 DIAGNOSIS — O209 Hemorrhage in early pregnancy, unspecified: Secondary | ICD-10-CM

## 2016-03-31 NOTE — MAU Note (Signed)
Patient presents to MAU via EMS with complaints of vaginal bleeding and cramping. Spotting for a couple of weeks and been seen for that. Heavy bleeding began today. Rating pain 10/10.

## 2016-04-01 ENCOUNTER — Inpatient Hospital Stay (HOSPITAL_COMMUNITY): Payer: Medicaid Other

## 2016-04-01 DIAGNOSIS — O034 Incomplete spontaneous abortion without complication: Secondary | ICD-10-CM | POA: Diagnosis not present

## 2016-04-01 DIAGNOSIS — Z79899 Other long term (current) drug therapy: Secondary | ICD-10-CM | POA: Diagnosis not present

## 2016-04-01 LAB — CBC
HEMATOCRIT: 31.5 % — AB (ref 36.0–46.0)
Hemoglobin: 10.5 g/dL — ABNORMAL LOW (ref 12.0–15.0)
MCH: 28.3 pg (ref 26.0–34.0)
MCHC: 33.3 g/dL (ref 30.0–36.0)
MCV: 84.9 fL (ref 78.0–100.0)
Platelets: 220 10*3/uL (ref 150–400)
RBC: 3.71 MIL/uL — ABNORMAL LOW (ref 3.87–5.11)
RDW: 14.9 % (ref 11.5–15.5)
WBC: 6.1 10*3/uL (ref 4.0–10.5)

## 2016-04-01 LAB — HCG, QUANTITATIVE, PREGNANCY: hCG, Beta Chain, Quant, S: 3966 m[IU]/mL — ABNORMAL HIGH (ref <5)

## 2016-04-01 MED ORDER — NALBUPHINE HCL 10 MG/ML IJ SOLN
5.0000 mg | Freq: Once | INTRAMUSCULAR | Status: AC
  Administered 2016-04-01: 5 mg via INTRAMUSCULAR
  Filled 2016-04-01: qty 1

## 2016-04-01 MED ORDER — OXYCODONE-ACETAMINOPHEN 5-325 MG PO TABS: 1.0000 | ORAL_TABLET | Freq: Four times a day (QID) | ORAL | 0 refills | Status: DC | PRN

## 2016-04-01 MED ORDER — MISOPROSTOL 200 MCG PO TABS: ORAL_TABLET | ORAL | 1 refills | Status: DC

## 2016-04-01 NOTE — MAU Provider Note (Signed)
Chief Complaint: Vaginal Bleeding and Abdominal Cramping   First Provider Initiated Contact with Patient 03/31/16 2352        SUBJECTIVE HPI: Monica Shields is a 26 y.o. G1P0 at [redacted]w[redacted]d by LMP who presents to maternity admissions reporting increased bleeding and cramping.  Was seen on 03/25/16 and found to have what looked like an inevitable abortion.  She was bleeding less then.  Heavy bleeding and cramping started this evening.  She denies vaginal itching/burning, urinary symptoms, h/a, dizziness, n/v, or fever/chills.     Vaginal Bleeding  The patient's primary symptoms include pelvic pain and vaginal bleeding. The patient's pertinent negatives include no genital itching, genital lesions or genital odor. This is a recurrent problem. The current episode started today. The problem occurs intermittently. The problem has been waxing and waning. The pain is severe. The problem affects both sides. She is pregnant. Associated symptoms include abdominal pain and back pain. Pertinent negatives include no chills, constipation, diarrhea, dysuria, fever, flank pain, frequency, headaches, nausea or vomiting. The vaginal discharge was bloody. The vaginal bleeding is heavier than menses. She has been passing clots. She has been passing tissue. She has tried nothing for the symptoms.   RN Note: Patient presents to MAU via EMS with complaints of vaginal bleeding and cramping. Spotting for a couple of weeks and been seen for that. Heavy bleeding began today. Rating pain 10/10.   History reviewed. No pertinent past medical history. History reviewed. No pertinent surgical history. Social History   Social History  . Marital status: Single    Spouse name: N/A  . Number of children: N/A  . Years of education: N/A   Occupational History  . Not on file.   Social History Main Topics  . Smoking status: Never Smoker  . Smokeless tobacco: Never Used  . Alcohol use No  . Drug use: Unknown  . Sexual activity: Not  on file   Other Topics Concern  . Not on file   Social History Narrative  . No narrative on file   No current facility-administered medications on file prior to encounter.    Current Outpatient Prescriptions on File Prior to Encounter  Medication Sig Dispense Refill  . Prenatal Vit-Fe Fumarate-FA (PRENATAL COMPLETE) 14-0.4 MG TABS Take 1 tablet by mouth daily. 30 each 0  . Naproxen Sodium 220 MG CAPS Take 2 capsules (440 mg total) by mouth every 12 (twelve) hours as needed. (Patient not taking: Reported on 09/18/2014) 60 each    No Known Allergies  I have reviewed patient's Past Medical Hx, Surgical Hx, Family Hx, Social Hx, medications and allergies.   ROS:  Review of Systems  Constitutional: Negative for chills and fever.  Gastrointestinal: Positive for abdominal pain. Negative for constipation, diarrhea, nausea and vomiting.  Genitourinary: Positive for pelvic pain and vaginal bleeding. Negative for dysuria, flank pain and frequency.  Musculoskeletal: Positive for back pain.  Neurological: Negative for headaches.   Review of Systems  Other systems negative   Physical Exam  Physical Exam Patient Vitals for the past 24 hrs:  BP Temp Temp src Pulse Resp Height Weight  03/31/16 0031 113/79 99 F (37.2 C) Oral (!) 125 18 5\' 1"  (1.549 m) 115 lb (52.2 kg)   Constitutional: Well-developed, well-nourished female in no acute distress.  Cardiovascular: normal rate Respiratory: normal effort GI: Abd soft, non-tender. Pos BS x 4 MS: Extremities nontender, no edema, normal ROM Neurologic: Alert and oriented x 4.  GU: Neg CVAT.  PELVIC EXAM: moderately heavy,  watery blood from vagina. No clots.  4cm spot of blood on pad, but was heavier short time ago.  LAB RESULTS Results for orders placed or performed during the hospital encounter of 03/31/16 (from the past 24 hour(s))  CBC     Status: Abnormal   Collection Time: 04/01/16 12:05 AM  Result Value Ref Range   WBC 6.1 4.0 - 10.5  K/uL   RBC 3.71 (L) 3.87 - 5.11 MIL/uL   Hemoglobin 10.5 (L) 12.0 - 15.0 g/dL   HCT 40.931.5 (L) 81.136.0 - 91.446.0 %   MCV 84.9 78.0 - 100.0 fL   MCH 28.3 26.0 - 34.0 pg   MCHC 33.3 30.0 - 36.0 g/dL   RDW 78.214.9 95.611.5 - 21.315.5 %   Platelets 220 150 - 400 K/uL  hCG, quantitative, pregnancy     Status: Abnormal   Collection Time: 04/01/16 12:05 AM  Result Value Ref Range   hCG, Beta Chain, Quant, S 3,966 (H) <5 mIU/mL       IMAGING [place] Ob Comp < 14 Wks  Result Date: 03/25/2016 CLINICAL DATA:  Bleeding for 1 day. EXAM: OBSTETRIC <14 WK US AND TRANSVAGINAL OB US TECHNIQUE: Both transabdominal and transvaginal ultrasound examinations were performed for complete evaluation of the gestation as well as the maternal uterus, adnexal regions, and pelvic cul-de-sac. Transvaginal technique was performed to assess early pregnancy. COMPARISON:  None. FINDINGS: Intrauterine gestational sac: Single Yolk sac:  Present Embryo:  Not seen Cardiac Activity: Not seen Heart Rate: Not applicable MSD: 25  mm   7 w   4  d CRL:    mm    w    d                  US EDC: Subchorionic hemorrhage:  None visualized. Maternal uterus/adnexae: Maternal right ovary appears normal. Left ovary is not seen but there is no mass or free fluid identified in the left adnexal region. IMPRESSION: Single intrauterine gestational sac. No embryo identified and there is complex fluid within the gestational sac, highly suspicious for pregnancy failure. Recommend follow-up with serial beta HCG levels and pelvic ultrasound as needed. Electronically Signed   By: Bary RichardStan  Maynard M.D.   On: 03/25/2016 22:20   [place] Ob Transvaginal  Result Date: 04/01/2016 CLINICAL DATA:  26 y/o  F; vaginal bleeding today. EXAM: TRANSVAGINAL OB ULTRASOUND TECHNIQUE: Transvaginal ultrasound was performed for complete evaluation of the gestation as well as the maternal uterus, adnexal regions, and pelvic cul-de-sac. COMPARISON:  03/25/2016 abdominal ultrasound. FINDINGS: Intrauterine  gestational sac: None Yolk sac:  Not Visualized. Embryo:  Not Visualized. Maternal uterus/adnexae: The endometrium is heterogeneous, avascular, and thickened measuring up to 18 mm. No intrauterine gestational sac is identified. Bilateral adnexa and ovaries are unremarkable. IMPRESSION: 1. Previously identified gestational sac is no longer identified consistent with pregnancy failure. 2. Heterogeneous thickened endometrium probably representing blood products/debris. No abnormal vascularity to suggest retained products of conception. Electronically Signed   By: Mitzi HansenLance  Furusawa-Stratton M.D.   On: 04/01/2016 00:30   [place] Ob Transvaginal  Result Date: 03/25/2016 CLINICAL DATA:  Bleeding for 1 day. EXAM: OBSTETRIC <14 WK US AND TRANSVAGINAL OB US TECHNIQUE: Both transabdominal and transvaginal ultrasound examinations were performed for complete evaluation of the gestation as well as the maternal uterus, adnexal regions, and pelvic cul-de-sac. Transvaginal technique was performed to assess early pregnancy. COMPARISON:  None. FINDINGS: Intrauterine gestational sac: Single Yolk sac:  Present Embryo:  Not seen Cardiac Activity: Not seen Heart Rate:  Not applicable MSD: 25  mm   7 w   4  d CRL:    mm    w    d                  [place] EDC: Subchorionic hemorrhage:  None visualized. Maternal uterus/adnexae: Maternal right ovary appears normal. Left ovary is not seen but there is no mass or free fluid identified in the left adnexal region. IMPRESSION: Single intrauterine gestational sac. No embryo identified and there is complex fluid within the gestational sac, highly suspicious for pregnancy failure. Recommend follow-up with serial beta HCG levels and pelvic ultrasound as needed. Electronically Signed   By: Bary Richard M.D.   On: 03/25/2016 22:20     MAU Management/MDM: Ordered followup CBC to check blood loss, hemoglobin not much less than value from last year.  Will recheck Ultrasound to evaluate status of miscarriage.     US showed only complex fluid in uterus. Likely clot.  GS no longer seen.  Discussed options of expectant management vs Cytotec.  She wants Rx for Cytotec, and may or may not take it. Discussed bleeding may resolve on its own.  Will also give Rx for a few Percocet.  She would [facility]up [facility] clinic [facility] than [facility].   ASSESSMENT 1. Antepartum bleeding, first trimester   2.     Incomplete spontaneous abortion.  PLAN Discharge home Rx Cytotec x for buccal placement Rx Percocet #15 for pain Ibuprofen for pain Message sent to clinic for followup in 2-3 weeks  Pt stable at time of discharge. Encouraged to return here or to other Urgent Care/ED if she develops worsening of symptoms, increase in pain, fever, or other concerning symptoms.    Wynelle Bourgeois CNM, MSN Certified Nurse-Midwife 04/01/2016  12:13 AM

## 2016-04-01 NOTE — Discharge Instructions (Signed)
Incomplete Miscarriage °A miscarriage is the sudden loss of an unborn baby (fetus) before the 20th week of pregnancy. In an incomplete miscarriage, parts of the fetus or placenta (afterbirth) remain in the body. °Having a miscarriage can be an emotional experience. Talk with your health care provider about any questions you may have about miscarrying, the grieving process, and your future pregnancy plans. °What are the causes? °· Problems with the fetal chromosomes that make it impossible for the baby to develop normally. Problems with the baby's genes or chromosomes are most often the result of errors that occur by chance as the embryo divides and grows. The problems are not inherited from the parents. °· Infection of the cervix or uterus. °· Hormone problems. °· Problems with the cervix, such as having an incompetent cervix. This is when the tissue in the cervix is not strong enough to hold the pregnancy. °· Problems with the uterus, such as an abnormally shaped uterus, uterine fibroids, or congenital abnormalities. °· Certain medical conditions. °· Smoking, drinking alcohol, or taking illegal drugs. °· Trauma. °What are the signs or symptoms? °· Vaginal bleeding or spotting, with or without cramps or pain. °· Pain or cramping in the abdomen or lower back. °· Passing fluid, tissue, or blood clots from the vagina. °How is this diagnosed? °Your health care provider will perform a physical exam. You may also have an ultrasound to confirm the miscarriage. Blood or urine tests may also be ordered. °How is this treated? °· Usually, a dilation and curettage (D&C) procedure is performed. During a D&C procedure, the cervix is widened (dilated) and any remaining fetal or placental tissue is gently removed from the uterus. °· Antibiotic medicines are prescribed if there is an infection. Other medicines may be given to reduce the size of the uterus (contract) if there is a lot of bleeding. °· If you have Rh negative blood and  your baby was Rh positive, you will need a Rho (D) immune globulin shot. This shot will protect any future baby from having Rh blood problems in future pregnancies. °· You may be confined to bed rest. This means you should stay in bed and only get up to use the bathroom. °Follow these instructions at home: °· Rest as directed by your health care provider. °· Restrict activity as directed by your health care provider. You may be allowed to continue light activity if curettage was not done but you require further treatment. °· Keep track of the number of pads you use each day. Keep track of how soaked (saturated) they are. Record this information. °· Do not  use tampons. °· Do not douche or have sexual intercourse until approved by your health care provider. °· Keep all follow-up appointments for reevaluation and continuing management. °· Only take over-the-counter or prescription medicines for pain, fever, or discomfort as directed by your health care provider. °· Take antibiotic medicine as directed by your health care provider. Make sure you finish it even if you start to feel better. °Get help right away if: °· You experience severe cramps in your stomach, back, or abdomen. °· You have an unexplained temperature (make sure to record these temperatures). °· You pass large clots or tissue (save these for your health care provider to inspect). °· Your bleeding increases. °· You become light-headed, weak, or have fainting episodes. °This information is not intended to replace advice given to you by your health care provider. Make sure you discuss any questions you have with your health   care provider. Document Released: 02/06/2005 Document Revised: 07/15/2015 Document Reviewed: 09/05/2012 Elsevier Interactive Patient Education  2017 [employer]  FACTS YOU SHOULD KNOW ABOUT CYTOTEC (Misoprostol)  WHAT IS AN EARLY PREGNANCY FAILURE? Once the egg is fertilized with the sperm and begins to develop, it attaches to  the lining of the uterus. This early pregnancy tissue may not develop into an embryo (the beginning stage of a baby). Sometimes an embryo does develop but does not continue to grow. These problems can be seen on ultrasound.   MANAGEMNT OF EARLY PREGNANCY FAILURE: About 4 out of 100 (0.25%) women will have a pregnancy loss in her lifetime.  One in five pregnancies is found to be an early pregnancy failure.  There are 3 ways to care for an early pregnancy failure:   (1) Surgery, (2) Medicine, (3) Waiting for you to pass the pregnancy on your own. The decision as to how to proceed after being diagnosed with and early pregnancy failure is an individual one.  The decision can be made only after appropriate counseling.  You need to weigh the pros and cons of the 3 choices. Then you can make the choice that works for you. SURGERY (D&E)  Procedure over in 1 day  Requires being put to sleep  Bleeding may be light  Possible problems during surgery, including injury to womb(uterus)  Care provider has more control Medicine (CYTOTEC)  The complete procedure may take days to weeks  No Surgery  Bleeding may be heavy at times  There may be drug side effects  Patient has more control Waiting  You may choose to wait, in which case your own body may complete the passing of the abnormal early pregnancy on its own in about 2-4 weeks  Your bleeding may be heavy at times  There is a small possibility that you may need surgery if the bleeding is too much or not all of the pregnancy has passed. CYTOTEC MANAGEMENT Prostaglandins (cytotec) are the most widely used drug for this purpose. They cause the uterus to cramp and contract. You will place the medicine yourself inside your vagina in the privacy of your home. Empting of the uterus should occur within 3 days but the process may continue for several weeks. The bleeding may seem heavy at times. POSSIBLE SIDE EFFECTS FROM CYTOTEC  Nausea    Vomiting  Diarrhea Fever  Chills  Hot Flashes Side effects  from the process of the early pregnancy failure include:  Cramping  Bleeding  Headaches  Dizziness RISKS: This is a low risk procedure. Less than 1 in 100 women has a complication. An incomplete passage of the early pregnancy may occur. Also, Hemorrhage (heavy bleeding) could happen.  Rarely the pregnancy will not be passed completely. Excessively heavy bleeding may occur.  Your doctor may need to perform surgery to empty the uterus (D&E). Afterwards: Everybody will feel differently after the early pregnancy completion. You may have soreness or cramps for a day or two. You may have soreness or cramps for day or two.  You may have light bleeding for up to 2 weeks. You may be as active as you feel like being. If you have any of the following problems you may call Maternity Admissions Unit at [phone].  If you have pain that does not get better  with pain medication  Bleeding that soaks through 2 thick full-sized sanitary pads in an hour  Cramps that last longer than 2 days  Foul smelling discharge  Fever  above 100.4 degrees F Even if you do not have any of these symptoms, you should have a follow-up exam to make sure you are healing properly. This appointment will be made for you before you leave the hospital. Your next normal period will start again in 4-6 week after the loss. You can get pregnant soon after the loss, so use birth control right away. Finally: Make sure all your questions are answered before during and after any procedure. Follow up with medical care and family planning methods.

## 2016-04-25 ENCOUNTER — Encounter: Payer: BLUE CROSS/BLUE SHIELD | Admitting: Obstetrics & Gynecology

## 2016-12-01 ENCOUNTER — Encounter (HOSPITAL_BASED_OUTPATIENT_CLINIC_OR_DEPARTMENT_OTHER): Payer: Self-pay | Admitting: *Deleted

## 2016-12-01 ENCOUNTER — Emergency Department (HOSPITAL_BASED_OUTPATIENT_CLINIC_OR_DEPARTMENT_OTHER)
Admission: EM | Admit: 2016-12-01 | Discharge: 2016-12-01 | Disposition: A | Discharge status: Home / Self Care | Payer: Self-pay | Attending: Emergency Medicine | Admitting: Emergency Medicine

## 2016-12-01 DIAGNOSIS — Z79899 Other long term (current) drug therapy: Secondary | ICD-10-CM | POA: Insufficient documentation

## 2016-12-01 DIAGNOSIS — R102 Pelvic and perineal pain: Secondary | ICD-10-CM | POA: Insufficient documentation

## 2016-12-01 DIAGNOSIS — N938 Other specified abnormal uterine and vaginal bleeding: Secondary | ICD-10-CM | POA: Insufficient documentation

## 2016-12-01 DIAGNOSIS — M545 Low back pain: Secondary | ICD-10-CM | POA: Insufficient documentation

## 2016-12-01 HISTORY — DX: Complete or unspecified spontaneous abortion without complication: O03.9

## 2016-12-01 LAB — URINALYSIS, ROUTINE W REFLEX MICROSCOPIC
Bilirubin Urine: NEGATIVE
Glucose, UA: NEGATIVE mg/dL
Ketones, ur: NEGATIVE mg/dL
Leukocytes, UA: NEGATIVE
Nitrite: NEGATIVE
Protein, ur: NEGATIVE mg/dL
Specific Gravity, Urine: 1.015 (ref 1.005–1.030)
pH: 7.5 (ref 5.0–8.0)

## 2016-12-01 LAB — WET PREP, GENITAL
Sperm: NONE SEEN
Trich, Wet Prep: NONE SEEN
Yeast Wet Prep HPF POC: NONE SEEN

## 2016-12-01 LAB — PREGNANCY, URINE: Preg Test, Ur: NEGATIVE

## 2016-12-01 LAB — URINALYSIS, MICROSCOPIC (REFLEX): WBC, UA: NONE SEEN WBC/hpf (ref 0–5)

## 2016-12-01 NOTE — ED Provider Notes (Signed)
[facility] Provider Note   CSN: [id number] Arrival date & time: 12/01/16  2021     History   Chief Complaint Chief Complaint  Patient presents with  . Vaginal Bleeding    HPI Monica Shields is a 26 y.o. female with history of miscarriage Who presents with a one-day history of vaginal bleeding. She reports she had a positive pregnancy test at home last month. She is concerned about miscarriage. She has had some associated lower abdominal cramping and low back pain. She's had some associated blood clots, however bleeding is not excessive. She denies any fevers, nausea, vomiting, concern for STD exposure.  HPI  Past Medical History:  Diagnosis Date  . Miscarriage     There are no active problems to display for this patient.   Past Surgical History:  Procedure Laterality Date  . DILATION AND CURETTAGE OF UTERUS    . LEEP    . TONSILLECTOMY      OB History    Gravida Para Term Preterm AB Living   1             SAB TAB Ectopic Multiple Live Births                   Home Medications    Prior to Admission medications   Medication Sig Start Date End Date Taking? Authorizing Provider  Prenatal Vit-Fe Fumarate-FA (PRENATAL COMPLETE) 14-0.4 MG TABS Take 1 tablet by mouth daily. 09/18/14   Dione Booze, MD    Family History History reviewed. No pertinent family history.  Social History Social History  Substance Use Topics  . Smoking status: Never Smoker  . Smokeless tobacco: Never Used  . Alcohol use No     Allergies   Patient has no known allergies.   Review of Systems Review of Systems  Constitutional: Negative for chills and fever.  HENT: Negative for facial swelling and sore throat.   Respiratory: Negative for shortness of breath.   Cardiovascular: Negative for chest pain.  Gastrointestinal: Negative for abdominal pain, nausea and vomiting.  Genitourinary: Positive for pelvic pain and vaginal bleeding. Negative for dysuria.    Musculoskeletal: Positive for back pain.  Skin: Negative for rash and wound.  Neurological: Negative for light-headedness and headaches.  Psychiatric/Behavioral: The patient is not nervous/anxious.      Physical Exam Updated Vital Signs BP 113/81   Pulse 68   Temp 98.4 F (36.9 C) (Oral)   Resp 16   Ht  (1.549 m)   Wt 52.2 kg (115 lb)   LMP 10/26/2015 Comment: LMP date is not correct, pt reports u/s done week of 1-30 and was told [redacted] weeks pregnant.     SpO2 100%   BMI 21.73 kg/m   Physical Exam  Constitutional: She appears well-developed and well-nourished. No distress.  HENT:  Head: Normocephalic and atraumatic.  Mouth/Throat: Oropharynx is clear and moist. No oropharyngeal exudate.  Eyes: Pupils are equal, round, and reactive to light. Conjunctivae are normal. Right eye exhibits no discharge. Left eye exhibits no discharge. No scleral icterus.  Neck: Normal range of motion. Neck supple. No thyromegaly present.  Cardiovascular: Normal rate, regular rhythm, normal heart sounds and intact distal pulses.  Exam reveals no gallop and no friction rub.   No murmur heard. Pulmonary/Chest: Effort normal and breath sounds normal. No stridor. No respiratory distress. She has no wheezes. She has no rales.  Abdominal: Soft. Bowel sounds are normal. She exhibits no distension. There is no tenderness.  There is no rebound and no guarding.  Genitourinary: Pelvic exam was performed with patient supine. Uterus is tender. Cervix exhibits no motion tenderness. Right adnexum displays no tenderness. Left adnexum displays no tenderness. There is bleeding in the vagina.  Musculoskeletal: She exhibits no edema.  Lymphadenopathy:    She has no cervical adenopathy.  Neurological: She is alert. Coordination normal.  Skin: Skin is warm and dry. No rash noted. She is not diaphoretic. No pallor.  Psychiatric: She has a normal mood and affect.  Nursing note and vitals reviewed.    ED Treatments /  Results  Labs (all labs ordered are listed, but only abnormal results are displayed) Labs Reviewed  WET PREP, GENITAL - Abnormal; Notable for the following:       Result Value   Clue Cells Wet Prep HPF POC PRESENT (*)    WBC, Wet Prep HPF POC FEW (*)    All other components within normal limits  URINALYSIS, ROUTINE W REFLEX MICROSCOPIC - Abnormal; Notable for the following:    Hgb urine dipstick MODERATE (*)    All other components within normal limits  URINALYSIS, MICROSCOPIC (REFLEX) - Abnormal; Notable for the following:    Bacteria, UA RARE (*)    Squamous Epithelial / LPF 0-5 (*)    All other components within normal limits  PREGNANCY, URINE  GC/CHLAMYDIA PROBE AMP ([place]) NOT AT [facility]    EKG  EKG Interpretation None       Radiology No results found.  Procedures Procedures (including critical care time)  Medications Ordered in ED Medications - No data to display   Initial Impression / Assessment and Plan / ED Course  I have reviewed the triage vital signs and the nursing notes.  Pertinent labs & imaging results that were available during my care of the patient were reviewed by me and considered in my medical decision making (see chart for details).     Pelvic exam benign except for bleeding. Urine pregnancy is negative today. Wet prep shows clue cells only. UA shows moderate hematuria, rule out bacteria. Considering urine pregnancy negative, most likely dysfunctional uterine bleeding. Unsure of the validity of at home pregnancy test, as patient was having spontaneous abortion today, urine pregnancy would be positive. Patient advised to follow-up with OB/GYN for further evaluation and treatment. Strict return precautions given. Patient understands and agrees with plan. Patient vitals stable throughout ED course and discharged in satisfactory condition. I discussed patient case with Dr. Criss Alvine who guided the patient's management and agrees with plan.   Final  Clinical Impressions(s) / ED Diagnoses   Final diagnoses:  Dysfunctional uterine bleeding    New Prescriptions Discharge Medication List as of 12/01/2016 10:12 PM       Emi Holes, PA-C 12/01/16 2323    Tilden Fossa, MD 12/06/16 [phone]

## 2016-12-01 NOTE — Discharge Instructions (Signed)
You will be called in 3 business days if you're positive for gonorrhea and chlamydia. Please follow-up with the health department or OB/GYN for treatment if positive. Please follow-up with your OB/GYN as soon as possible for further evaluation and treatment of your symptoms. Please return to emergency department or go to the [facility] emergency department if you develop any new or worsening symptoms including severe pelvic pain, significant increase in vaginal bleeding, or any other new or concerning symptoms.

## 2016-12-01 NOTE — ED Triage Notes (Signed)
Pt c/o vaginal bleeding x 2 days  , positive preg test last month , no prenatal care, 9/05

## 2016-12-01 NOTE — ED Notes (Signed)
Pt states her last period was sept 25 and has had a positive pregnancy test since then and today started having moderate amount of bright red blood and clots and has had miscarriages in the past and lower abdominal pain and low back pain today.

## 2016-12-01 NOTE — ED Notes (Signed)
Pt discharged to home with family. NAD.  

## 2016-12-04 LAB — GC/CHLAMYDIA PROBE AMP (~~LOC~~) NOT AT ARMC
Chlamydia: POSITIVE — AB
Neisseria Gonorrhea: NEGATIVE

## 2016-12-07 ENCOUNTER — Telehealth: Payer: Self-pay | Admitting: Medical

## 2016-12-07 DIAGNOSIS — A749 Chlamydial infection, unspecified: Secondary | ICD-10-CM

## 2016-12-07 MED ORDER — AZITHROMYCIN 250 MG PO TABS: 1000.0000 mg | ORAL_TABLET | Freq: Once | ORAL | 0 refills | Status: AC

## 2016-12-07 NOTE — Telephone Encounter (Addendum)
Monica Shields tested positive for  Chlamydia. Patient was called by RN and allergies and pharmacy confirmed. Rx sent to pharmacy of choice.   Marny LowensteinWenzel, Julie N, PA-C 12/07/2016 10:16 AM      ----- Message from Kathe BectonLori S Berdik, RN sent at 12/07/2016 10:03 AM EDT ----- This patient tested positive for chlamydia  She ::"has NKDA", I have informed the patient of her results and confirmed her pharmacy is correct in her chart. Please send Rx.   Thank you,   Kathe BectonBerdik, Lori S, RN   [facility]

## 2017-01-11 ENCOUNTER — Encounter (HOSPITAL_COMMUNITY): Payer: Self-pay

## 2017-07-04 ENCOUNTER — Inpatient Hospital Stay (HOSPITAL_COMMUNITY): Payer: Medicaid Other

## 2017-07-04 ENCOUNTER — Inpatient Hospital Stay (HOSPITAL_COMMUNITY)
Admission: AD | Admit: 2017-07-04 | Discharge: 2017-07-05 | DRG: 831 | Disposition: A | Discharge status: Other Acute Inpatient Hospital | Payer: Medicaid Other | Source: Ambulatory Visit | Attending: Obstetrics and Gynecology | Admitting: Obstetrics and Gynecology

## 2017-07-04 ENCOUNTER — Encounter: Payer: Self-pay | Admitting: Student

## 2017-07-04 ENCOUNTER — Other Ambulatory Visit: Payer: Self-pay

## 2017-07-04 DIAGNOSIS — O4692 Antepartum hemorrhage, unspecified, second trimester: Secondary | ICD-10-CM

## 2017-07-04 DIAGNOSIS — O42912 Preterm premature rupture of membranes, unspecified as to length of time between rupture and onset of labor, second trimester: Secondary | ICD-10-CM | POA: Diagnosis present

## 2017-07-04 DIAGNOSIS — O321XX0 Maternal care for breech presentation, not applicable or unspecified: Secondary | ICD-10-CM | POA: Diagnosis present

## 2017-07-04 DIAGNOSIS — Z3A23 23 weeks gestation of pregnancy: Secondary | ICD-10-CM

## 2017-07-04 DIAGNOSIS — O42919 Preterm premature rupture of membranes, unspecified as to length of time between rupture and onset of labor, unspecified trimester: Secondary | ICD-10-CM | POA: Diagnosis present

## 2017-07-04 LAB — CBC
HEMATOCRIT: 27.5 % — AB (ref 36.0–46.0)
HEMOGLOBIN: 9.2 g/dL — AB (ref 12.0–15.0)
MCH: 28.4 pg (ref 26.0–34.0)
MCHC: 33.5 g/dL (ref 30.0–36.0)
MCV: 84.9 fL (ref 78.0–100.0)
Platelets: 255 10*3/uL (ref 150–400)
RBC: 3.24 MIL/uL — ABNORMAL LOW (ref 3.87–5.11)
RDW: 13.3 % (ref 11.5–15.5)
WBC: 19.6 10*3/uL — ABNORMAL HIGH (ref 4.0–10.5)

## 2017-07-04 LAB — BASIC METABOLIC PANEL
Anion gap: 10 (ref 5–15)
CHLORIDE: 104 mmol/L (ref 101–111)
CO2: 20 mmol/L — AB (ref 22–32)
Calcium: 6.8 mg/dL — ABNORMAL LOW (ref 8.9–10.3)
Creatinine, Ser: 0.59 mg/dL (ref 0.44–1.00)
GFR calc Af Amer: >60 mL/min (ref >60)
GFR calc non Af Amer: >60 mL/min (ref >60)
Glucose, Bld: 166 mg/dL — ABNORMAL HIGH (ref 65–99)
Potassium: 3.8 mmol/L (ref 3.5–5.1)
Sodium: 134 mmol/L — ABNORMAL LOW (ref 135–145)

## 2017-07-04 LAB — RAPID URINE DRUG SCREEN, HOSP PERFORMED
Amphetamines: NOT DETECTED
BENZODIAZEPINES: NOT DETECTED
Barbiturates: NOT DETECTED
COCAINE: NOT DETECTED
OPIATES: NOT DETECTED
TETRAHYDROCANNABINOL: NOT DETECTED

## 2017-07-04 LAB — GC/CHLAMYDIA PROBE AMP (~~LOC~~) NOT AT ARMC
CHLAMYDIA, DNA PROBE: NEGATIVE
Neisseria Gonorrhea: NEGATIVE

## 2017-07-04 LAB — TYPE AND SCREEN
ABO/RH(D): B POS
ANTIBODY SCREEN: NEGATIVE

## 2017-07-04 LAB — WET PREP, GENITAL
Clue Cells Wet Prep HPF POC: NONE SEEN
SPERM: NONE SEEN
Trich, Wet Prep: NONE SEEN
YEAST WET PREP: NONE SEEN

## 2017-07-04 LAB — ABO/RH: ABO/RH(D): B POS

## 2017-07-04 LAB — MAGNESIUM: Magnesium: 6.2 mg/dL (ref 1.7–2.4)

## 2017-07-04 LAB — HIV ANTIBODY (ROUTINE TESTING W REFLEX): HIV SCREEN 4TH GENERATION: NONREACTIVE

## 2017-07-04 MED ORDER — ACETAMINOPHEN 325 MG PO TABS
650.0000 mg | ORAL_TABLET | ORAL | Status: DC | PRN
  Administered 2017-07-04: 650 mg via ORAL
  Filled 2017-07-04: qty 2

## 2017-07-04 MED ORDER — SODIUM CHLORIDE 0.9 % IV SOLN
500.0000 mg | INTRAVENOUS | Status: AC
  Administered 2017-07-04 – 2017-07-05 (×2): 500 mg via INTRAVENOUS
  Filled 2017-07-04 (×2): qty 500

## 2017-07-04 MED ORDER — MAGNESIUM SULFATE BOLUS VIA INFUSION
4.0000 g | Freq: Once | INTRAVENOUS | Status: AC
  Administered 2017-07-04: 4 g via INTRAVENOUS
  Filled 2017-07-04: qty 500

## 2017-07-04 MED ORDER — NIFEDIPINE ER OSMOTIC RELEASE 30 MG PO TB24
30.0000 mg | ORAL_TABLET | Freq: Two times a day (BID) | ORAL | Status: DC
  Administered 2017-07-04 – 2017-07-05 (×3): 30 mg via ORAL
  Filled 2017-07-04 (×4): qty 1

## 2017-07-04 MED ORDER — DOCUSATE SODIUM 100 MG PO CAPS
100.0000 mg | ORAL_CAPSULE | Freq: Every day | ORAL | Status: DC
  Administered 2017-07-04 – 2017-07-05 (×2): 100 mg via ORAL
  Filled 2017-07-04 (×2): qty 1

## 2017-07-04 MED ORDER — AZITHROMYCIN 250 MG PO TABS: 500.0000 mg | ORAL_TABLET | Freq: Every day | ORAL | Status: DC

## 2017-07-04 MED ORDER — NIFEDIPINE 10 MG PO CAPS
10.0000 mg | ORAL_CAPSULE | Freq: Four times a day (QID) | ORAL | Status: DC
  Administered 2017-07-04 (×2): 10 mg via ORAL
  Filled 2017-07-04 (×3): qty 1

## 2017-07-04 MED ORDER — SODIUM CHLORIDE 0.9 % IV SOLN
2.0000 g | Freq: Four times a day (QID) | INTRAVENOUS | Status: DC
  Administered 2017-07-04 – 2017-07-05 (×6): 2 g via INTRAVENOUS
  Filled 2017-07-04 (×2): qty 2000
  Filled 2017-07-04: qty 2
  Filled 2017-07-04: qty 2000
  Filled 2017-07-04 (×4): qty 2

## 2017-07-04 MED ORDER — NIFEDIPINE 10 MG PO CAPS
20.0000 mg | ORAL_CAPSULE | Freq: Once | ORAL | Status: AC
  Administered 2017-07-04: 20 mg via ORAL
  Filled 2017-07-04: qty 2

## 2017-07-04 MED ORDER — ZOLPIDEM TARTRATE 5 MG PO TABS: 5.0000 mg | ORAL_TABLET | Freq: Every evening | ORAL | Status: DC | PRN

## 2017-07-04 MED ORDER — BETAMETHASONE SOD PHOS & ACET 6 (3-3) MG/ML IJ SUSP
12.0000 mg | INTRAMUSCULAR | Status: AC
  Administered 2017-07-04 – 2017-07-05 (×2): 12 mg via INTRAMUSCULAR
  Filled 2017-07-04 (×3): qty 2

## 2017-07-04 MED ORDER — MAGNESIUM SULFATE 40 G IN LACTATED RINGERS - SIMPLE
2.0000 g/h | INTRAVENOUS | Status: DC
  Filled 2017-07-04: qty 500

## 2017-07-04 MED ORDER — LACTATED RINGERS IV SOLN
INTRAVENOUS | Status: DC
  Administered 2017-07-04 (×2): via INTRAVENOUS

## 2017-07-04 MED ORDER — MAGNESIUM SULFATE 40 G IN LACTATED RINGERS - SIMPLE
2.0000 g/h | INTRAVENOUS | Status: DC
  Administered 2017-07-04: 3 g/h via INTRAVENOUS
  Filled 2017-07-04: qty 40

## 2017-07-04 MED ORDER — NIFEDIPINE 10 MG PO CAPS: 10.0000 mg | ORAL_CAPSULE | Freq: Once | ORAL | Status: DC

## 2017-07-04 MED ORDER — PRENATAL MULTIVITAMIN CH
1.0000 | ORAL_TABLET | Freq: Every day | ORAL | Status: DC
  Administered 2017-07-04: 1 via ORAL
  Filled 2017-07-04: qty 1

## 2017-07-04 MED ORDER — ONDANSETRON HCL 4 MG/2ML IJ SOLN
4.0000 mg | Freq: Once | INTRAMUSCULAR | Status: DC
  Filled 2017-07-04: qty 2

## 2017-07-04 MED ORDER — ONDANSETRON HCL 4 MG/2ML IJ SOLN
4.0000 mg | Freq: Once | INTRAMUSCULAR | Status: AC
  Administered 2017-07-04: 4 mg via INTRAVENOUS

## 2017-07-04 MED ORDER — CALCIUM CARBONATE ANTACID 500 MG PO CHEW: 2.0000 | CHEWABLE_TABLET | ORAL | Status: DC | PRN

## 2017-07-04 NOTE — H&P (Signed)
Monica Shields is a 27 y.o. female presenting for uterine contractions and PPROM at home.  She started contracting at 11am today.  Pain got worse as the day went on so she called EMS.  When they arrived, they stated that she spontaneously ruptured her membranes at home, as evidenced by large gush of fluid.  .  She gets her care at [facility].  Wants to be transferred there.  States pregnancy has been uneventful.  Had a history of two miscarriages and was on Oral Progesterone until 12 weeks.  Does have a history of LEEP surgery in 2015.     OB History    Gravida  3   Para      Term      Preterm      AB  2   Living        SAB  2   TAB      Ectopic      Multiple      Live Births             Past Medical History:  Diagnosis Date  . Miscarriage    Past Surgical History:  Procedure Laterality Date  . DILATION AND CURETTAGE OF UTERUS    . LEEP    . TONSILLECTOMY     Family History: family history is not on file. Social History:  reports that she has never smoked. She has never used smokeless tobacco. She reports that she does not drink alcohol or use drugs.     Maternal Diabetes: No Genetic Screening: Normal Maternal Ultrasounds/Referrals: Normal Fetal Ultrasounds or other Referrals:  None Maternal Substance Abuse:  No Significant Maternal Medications:  None Significant Maternal Lab Results:  None Other Comments:  None  Review of Systems  Constitutional: Negative for chills and fever.  Gastrointestinal: Positive for abdominal pain. Negative for constipation, diarrhea, nausea and vomiting.  Neurological: Negative for dizziness and focal weakness.   Maternal Medical History:  Reason for admission: Rupture of membranes and contractions.  Nausea.  Contractions: Onset was less than 1 hour ago.   Frequency: regular.   Perceived severity is strong.    Fetal activity: Perceived fetal activity is normal.   Last perceived fetal movement was within the past hour.     Prenatal complications: Bleeding and preterm labor.   No PIH or pre-eclampsia.   Prenatal Complications - Diabetes: none.    Dilation: 3 Effacement (%): 80 Station: -1 Exam by:: Wynelle Bourgeois, CNM Blood pressure 135/71, pulse 90, temperature 98.2 F (36.8 C), resp. rate 19, unknown if currently breastfeeding. Maternal Exam:  Uterine Assessment: Contraction strength is moderate.  Contraction frequency is regular.   Abdomen: Patient reports generalized tenderness.  Fetal presentation: breech  Introitus: Normal vulva. Normal vagina.  Ferning test: positive.  Nitrazine test: not done.  Cervix: Cervix evaluated by digital exam.     Fetal Exam Fetal Monitor Review: Mode: ultrasound.   Baseline rate: 145.  Variability: moderate (6-25 bpm).   Pattern: accelerations present and no decelerations.    Fetal State Assessment: Category I - tracings are normal.     Physical Exam  Constitutional: She is oriented to person, place, and time. She appears well-developed and well-nourished. No distress (but quite uncomfortable).  HENT:  Head: Normocephalic.  Cardiovascular: Normal rate and regular rhythm.  Respiratory: Effort normal.  GI: Soft. She exhibits no distension. There is generalized tenderness. There is no rebound and no guarding.  Musculoskeletal: Normal range of motion.  Neurological: She is alert  and oriented to person, place, and time.  Skin: Skin is warm and dry.  Psychiatric: She has a normal mood and affect.    Prenatal labs: ABO, Rh:   Antibody:   Rubella:   RPR:    HBsAg:    HIV:    GBS:     Assessment/Plan: Single IUP at [redacted]w[redacted]d Preterm Labor PPROM ? Abruption Malpresentation  Admit to [employer] Routine antenatal orders Consulted Dr Adrian Blackwater Betamethasone Series Magnesium Sulfate infusion Azithromycin and Ampicillin Observation for now, anticipate possible delivery NICU consult    Wynelle Bourgeois 07/04/2017, 3:37 AM

## 2017-07-04 NOTE — MAU Note (Signed)
Arrived via EMS- [facility] at 0230-clear fluid with reddish tint.  Receives care at [place].  Endorses feeling baby move.  Was on PO Progesterone during her first trimester for recurrent SABs. Also having CTX since yesterday.

## 2017-07-04 NOTE — Progress Notes (Signed)
Labor Progress Note  Monica Shields is a 27 y.o. G3P0020 at [redacted]w[redacted]d  admitted for Preterm labor, PPROM  S: Lying in bed on bed rest. No concerns. Feeling contractions but able to breath through them.   O:  BP (!) 107/48   Pulse (!) 102   Temp 98.2 F (36.8 C) (Oral)   Resp 18   Ht  (1.549 m)   Wt 132 lb (59.9 kg)   SpO2 95%   BMI 24.94 kg/m   Total I/O In: 475 [P.O.:100; I.V.:375] Out: 650 [Urine:650]  FHT:  FHR: 135 bpm, variability: minimal ,  accelerations:  Abscent,  decelerations:  Absent UC:   regular, every 7-8 minutes SVE:   Dilation: 3 Effacement (%): 80 Station: -1 Exam by:: DR Doroteo Glassman  PPROM: 0300, bloody  Labs: Lab Results  Component Value Date   WBC 19.6 (H) 07/04/2017   HGB 9.2 (L) 07/04/2017   HCT 27.5 (L) 07/04/2017   MCV 84.9 07/04/2017   PLT 255 07/04/2017    Assessment / Plan: 27 y.o. G3P0020 [redacted]w[redacted]d. Premature contractions and PPROM. ?Abruption Not progressing at this time  Labor: Continues to have contractions. Have spaced out with decreased intensity. Continue procardia. FSE had to be applied due to being unable to trace fetus for ~23min externally.   Preterm: NICU has been by to see patient for consult. S/p BMZ x1. Mag for neuroprophylaxis I/D: Latency antibiotics being given Fetal Wellbeing:  Category II Pain Control:  Labor support without medications Anticipated MOD:  NSVD   Caryl Ada, DO OB Fellow Center for [facility], [facility]

## 2017-07-04 NOTE — Progress Notes (Signed)
Went to reassess patient prior to transfer to antepartum. Patient is doing well. Contractions have subsided. Fetal monitoring for 12hrs unremarkable. Fetal tracing has been reassuring. FSE removed. Will transfer patient to antepartum in stable condition. Discussed with patient interventions requested. She states she wants all interventions at this time. Neonatology has spoken with patient.  Caryl Ada, DO OB Fellow Center for [facility], [facility]

## 2017-07-04 NOTE — Progress Notes (Signed)
This note also relates to the following rows which could not be included: SpO2 - Cannot attach notes to unvalidated device data  Discussion had with physicians in regards to FSE placement on 23 week fetus.  MD aware of RN concerns.  No changes made in POC at this time.

## 2017-07-04 NOTE — Anesthesia Pain Management Evaluation Note (Signed)
  [facility]NA Pain Management Visit Note  Patient: Monica Shields, 27 y.o., female  "Hello I am a member of the anesthesia team at [facility]. We have an anesthesia team available at all times to provide care throughout the hospital, including epidural management and anesthesia for C-section. I don't know your plan for the delivery whether it a natural birth, water birth, IV sedation, nitrous supplementation, doula or epidural, but we want to meet your pain goals."   1.Was your pain managed to your expectations on prior hospitalizations?   No   2.What is your expectation for pain management during this hospitalization?     Labor support without medications, Epidural, IV pain meds and Nitrous Oxide  3.How can we help you reach that goal? Pt very early and plan right now is to stop labor. If labor progresses, epidural is a consideration. All questions answered.  Record the patient's initial score and the patient's pain goal.   Pain: 8  Pain Goal: 10 [facility] wants you to be able to say your pain was always managed very well.  Michiko Lineman 07/04/2017

## 2017-07-04 NOTE — Progress Notes (Signed)
Patient ID: Monica Shields, female   DOB: October 24, 1990, 27 y.o.   MRN: [id number]  Contractions improved - not as painful, but still every 6-7 minutes. Will give another dose of procardia. If still contracting after second dose, may increase magnesium to 3g/hr.  07/04/2017 6:48 AM Levie Heritage, DO

## 2017-07-04 NOTE — Progress Notes (Signed)
Per Dr Ashok Pall: no additional fetal monitoring required prior to transfer to 3rd floor.

## 2017-07-04 NOTE — Consult Note (Signed)
[facility] --  [facility] 07/04/2017    10:55 AM  Neonatal Medicine Consultation         Monica Shields          MRN:  [id number]  I was called at the request of the patient's obstetrician (Dr. Adrian Blackwater) to speak to this patient due to impending preterm delivery as early as 23 3/7 weeks.    The patient's is 27 years old and is now 23 3/7 weeks with ruptured membranes (early this morning).  Her prenatal course had been uncomplicated, although she has had 2 miscarriages, and was on oral pregesterone until [redacted] weeks gestation..  She is admitted to L&D, and is receiving treatment that includes betamethasone, latency antibiotics, Procardia, and magnesium sulfate.  The baby is a female.  I reviewed expectations for a baby born at 23+ weeks, including survival, length of stay, morbidities such as respiratory distress, IVH, infection, feeding intolerance, and long term neurodevelopmental problems.  I described how we provide respiratory and feeding support.  I encouraged to provide breast milk, as best for the baby, with donor milk also available.  I let mom know that the baby's outlook generally improves the longer she remains undelivered.  I spent 20 minutes reviewing the record, speaking to the patient, and entering appropriate documentation.  More than 50% of the time was spent face to face with patient.   _____________________ Electronically Signed By: Angelita Ingles, MD Neonatologist

## 2017-07-05 DIAGNOSIS — O42912 Preterm premature rupture of membranes, unspecified as to length of time between rupture and onset of labor, second trimester: Principal | ICD-10-CM

## 2017-07-05 DIAGNOSIS — Z3A23 23 weeks gestation of pregnancy: Secondary | ICD-10-CM

## 2017-07-05 LAB — CULTURE, OB URINE: Culture: NO GROWTH

## 2017-07-05 LAB — RPR: RPR Ser Ql: NONREACTIVE

## 2017-07-05 MED ORDER — OXYCODONE-ACETAMINOPHEN 5-325 MG PO TABS
2.0000 | ORAL_TABLET | Freq: Once | ORAL | Status: AC
  Administered 2017-07-05: 2 via ORAL
  Filled 2017-07-05: qty 2

## 2017-07-05 MED ORDER — NIFEDIPINE ER OSMOTIC RELEASE 30 MG PO TB24
30.0000 mg | ORAL_TABLET | Freq: Once | ORAL | Status: AC
  Administered 2017-07-05: 30 mg via ORAL
  Filled 2017-07-05: qty 1

## 2017-07-05 NOTE — Discharge Summary (Signed)
Patient ID: Monica Shields, female   DOB: 1990-04-19, 27 y.o.   MRN: [id number] . Antenatal Physician Discharge Summary  Patient ID: Monica Shields MRN: [id number] DOB/AGE: [birth year] 27 y.o.  Admit date: 07/04/2017 Discharge date: 07/05/2017  Admission Diagnoses: IUP 23 4/7 weeks PROM   Discharge Diagnoses: SAA  Prenatal Procedures: ultrasound  Consults: Neonatology, Maternal Fetal Medicine  Significant Diagnostic Studies:  Results for orders placed or performed during the hospital encounter of 07/04/17 (from the past 168 hour(s))  GC/Chlamydia probe amp ([place])not at [facility]   Collection Time: 07/04/17 12:00 AM  Result Value Ref Range   Chlamydia Negative    Neisseria gonorrhea Negative   Wet prep, genital   Collection Time: 07/04/17  3:21 AM  Result Value Ref Range   Yeast Wet Prep HPF POC NONE SEEN NONE SEEN   Trich, Wet Prep NONE SEEN NONE SEEN   Clue Cells Wet Prep HPF POC NONE SEEN NONE SEEN   WBC, Wet Prep HPF POC FEW (A) NONE SEEN   Sperm NONE SEEN   HIV antibody (routine testing) (NOT for [facility])   Collection Time: 07/04/17  3:57 AM  Result Value Ref Range   HIV Screen 4th Generation wRfx Non Reactive Non Reactive  Type and screen [facility]   Collection Time: 07/04/17  3:57 AM  Result Value Ref Range   ABO/RH(D) B POS    Antibody Screen NEG    Sample Expiration      07/07/2017 Performed at [facility], [address], [place], [place] [address]   ABO/Rh   Collection Time: 07/04/17  3:57 AM  Result Value Ref Range   ABO/RH(D)      B POS Performed at [facility], [address] [place], [place] [address]   Culture, beta strep (group b only)   Collection Time: 07/04/17  9:19 AM  Result Value Ref Range   Specimen Description      VAGINAL/RECTAL Performed at [facility], [address] [place], [place] [address]    Special Requests      NONE Performed at [facility], [address], [place], [place]  [address]    Culture      CULTURE REINCUBATED FOR BETTER GROWTH Performed at [facility], [address], [place], [place] [address]    Report Status PENDING   CBC   Collection Time: 07/04/17  9:47 AM  Result Value Ref Range   WBC 19.6 (H) 4.0 - 10.5 K/uL   RBC 3.24 (L) 3.87 - 5.11 MIL/uL   Hemoglobin 9.2 (L) 12.0 - 15.0 g/dL   HCT 13.2 (L) 44.0 - 10.2 %   MCV 84.9 78.0 - 100.0 fL   MCH 28.4 26.0 - 34.0 pg   MCHC 33.5 30.0 - 36.0 g/dL   RDW 72.5 36.6 - 44.0 %   Platelets 255 150 - 400 K/uL  RPR   Collection Time: 07/04/17  9:47 AM  Result Value Ref Range   RPR Ser Ql Non Reactive Non Reactive  Basic metabolic panel   Collection Time: 07/04/17 12:26 PM  Result Value Ref Range   Sodium 134 (L) 135 - 145 mmol/L   Potassium 3.8 3.5 - 5.1 mmol/L   Chloride 104 101 - 111 mmol/L   CO2 20 (L) 22 - 32 mmol/L   Glucose, Bld 166 (H) 65 - 99 mg/dL   BUN <5 (L) 6 - 20 mg/dL   Creatinine, Ser 3.47 0.44 - 1.00 mg/dL   Calcium 6.8 (L)  8.9 - 10.3 mg/dL   GFR calc non Af Amer >60 >60 mL/min   GFR calc Af Amer >60 >60 mL/min   Anion gap 10 5 - 15  Magnesium   Collection Time: 07/04/17 12:26 PM  Result Value Ref Range   Magnesium 6.2 (HH) 1.7 - 2.4 mg/dL  Rapid urine drug screen (hospital performed)   Collection Time: 07/04/17  4:28 PM  Result Value Ref Range   Opiates NONE DETECTED NONE DETECTED   Cocaine NONE DETECTED NONE DETECTED   Benzodiazepines NONE DETECTED NONE DETECTED   Amphetamines NONE DETECTED NONE DETECTED   Tetrahydrocannabinol NONE DETECTED NONE DETECTED   Barbiturates NONE DETECTED NONE DETECTED    Treatments: IV hydration, antibiotics: azithromycin and Ampicillin, steroids: celestone 5/15 and 5/16 and magnesium  Hospital Course:  This is a 27 y.o. G3P0020 with IUP at [redacted]w[redacted]d admitted for PROM @ 0230 on 07/04/17.  She was admitted with contractions, noted to have a cervical exam of 3/80/-1.  She was initially started on magnesium sulfate for tocolysis and  neuroprotection and also received betamethasone x 2 doses.  Her tocolysis was transitioned to Procardia. She was seen by Neonatology during her stay.  She was observed, fetal heart rate monitoring remained reassuring, and she had no signs/symptoms of progressing preterm labor or other maternal-fetal concerns.  Her cervical exam was unchanged from admission. She desired transfer to [facility] as she is receiving her prenatal care at [facility] in [place].  Transfer was accepted by Dr. Thana Ates and [facility] was contacted for transfer to [facility]   .  Discharge Exam: BP (!) 98/40   Pulse (!) 108   Temp 98.4 F (36.9 C) (Oral)   Resp 16   Ht  (1.549 m)   Wt 59.9 kg (132 lb)   SpO2 94%   BMI 24.94 kg/m   Lungs clear  Heart RRR Abd soft + BS gravid non tender GU 3/80/-1 Ext non tedner  Discharge Condition: Stable  Disposition: Transferred [facility]      Signed: Hermina Staggers M.D. 07/05/2017, 1:01 PM

## 2017-07-05 NOTE — Progress Notes (Signed)
FACULTY PRACTICE ANTEPARTUM(COMPREHENSIVE) NOTE  Monica Shields is a 27 y.o. G3P0020 at [redacted]w[redacted]d  who is admitted for Preterm labor, PROM, cervix dilated to 3 /80-1, and hx LEEP at age 89 .   Fetal presentation is cephalic.  FSE  Placed yesterday 11:00 WAS removed prior to transfer to Ante Unit last pm Length of Stay:  1  Days  Subjective: PT is comfortable this a.m. Mag was d/c'd at 4 am. She had some mild pressure sx and so was given an additional dose Procardia at 5 am . She denies contractions now Patient reports the fetal movement as active. Patient reports uterine contraction  activity as none. Patient reports  vaginal bleeding as scant staining. Patient describes fluid per vagina as Clear.  Vitals:  Blood pressure (!) 106/58, pulse (!) 104, temperature 98.1 F (36.7 C), resp. rate 17, height  (1.549 m), weight 132 lb (59.9 kg), SpO2 96 %, unknown if currently breastfeeding. Physical Examination:  General appearance - alert, well appearing, and in no distress, oriented to person, place, and time and normal appearing weight Heart - normal rate and regular rhythm Abdomen - soft, nontender, nondistended Fundal Height:  size less than dates Cervical Exam: Not evaluated.  and fetal presentation is cephalic and was confirmed by u/s yesterday. Extremities: extremities normal, atraumatic, no cyanosis or edema and Homans sign is negative, no sign of DVT with DTRs 2+ bilaterally Membranes:intact, ruptured  Fetal Monitoring:  Toco: no contractions notable.  Labs:  Results for orders placed or performed during the hospital encounter of 07/04/17 (from the past 24 hour(s))  CBC   Collection Time: 07/04/17  9:47 AM  Result Value Ref Range   WBC 19.6 (H) 4.0 - 10.5 K/uL   RBC 3.24 (L) 3.87 - 5.11 MIL/uL   Hemoglobin 9.2 (L) 12.0 - 15.0 g/dL   HCT 40.9 (L) 81.1 - 91.4 %   MCV 84.9 78.0 - 100.0 fL   MCH 28.4 26.0 - 34.0 pg   MCHC 33.5 30.0 - 36.0 g/dL   RDW 78.2 95.6 - 21.3 %   Platelets  255 150 - 400 K/uL  RPR   Collection Time: 07/04/17  9:47 AM  Result Value Ref Range   RPR Ser Ql Non Reactive Non Reactive  Basic metabolic panel   Collection Time: 07/04/17 12:26 PM  Result Value Ref Range   Sodium 134 (L) 135 - 145 mmol/L   Potassium 3.8 3.5 - 5.1 mmol/L   Chloride 104 101 - 111 mmol/L   CO2 20 (L) 22 - 32 mmol/L   Glucose, Bld 166 (H) 65 - 99 mg/dL   BUN <5 (L) 6 - 20 mg/dL   Creatinine, Ser 0.86 0.44 - 1.00 mg/dL   Calcium 6.8 (L) 8.9 - 10.3 mg/dL   GFR calc non Af Amer >60 >60 mL/min   GFR calc Af Amer >60 >60 mL/min   Anion gap 10 5 - 15  Magnesium   Collection Time: 07/04/17 12:26 PM  Result Value Ref Range   Magnesium 6.2 (HH) 1.7 - 2.4 mg/dL  Rapid urine drug screen (hospital performed)   Collection Time: 07/04/17  4:28 PM  Result Value Ref Range   Opiates NONE DETECTED NONE DETECTED   Cocaine NONE DETECTED NONE DETECTED   Benzodiazepines NONE DETECTED NONE DETECTED   Amphetamines NONE DETECTED NONE DETECTED   Tetrahydrocannabinol NONE DETECTED NONE DETECTED   Barbiturates NONE DETECTED NONE DETECTED    Imaging Studies:    ** No u/s evidence of  abruption at posterior placenta. Medications:  Scheduled . [START ON 07/06/2017] azithromycin  500 mg Oral Daily  . docusate sodium  100 mg Oral Daily  . NIFEdipine  30 mg Oral BID  . prenatal multivitamin  1 tablet Oral Q1200   I have reviewed the patient's current medications.  ASSESSMENT: Patient Active Problem List   Diagnosis Date Noted  . Preterm premature rupture of membranes (PPROM) with unknown onset of labor 07/04/2017    PLAN: Continued inpt care.  Pt desires to be transferred if possible to [place], where she was receiving care at [facility]. Contact number [phone] Eunice Blase) according to pt who spoke with this RN yesterday. Pt may need confirmation of cervix stability if she's to be transferred. Ante # [phone] L&D # [phone] PAL line [phone] Tilda Burrow 07/05/2017,7:33 AM    Patient ID: Monica Shields, female   DOB: [birth year], 27 y.o.   MRN: [id number]

## 2017-07-05 NOTE — Progress Notes (Signed)
Report given to Joy RN at [facility] called for transport.

## 2017-07-06 LAB — CULTURE, BETA STREP (GROUP B ONLY)

## 2017-07-06 MED ORDER — TETANUS-DIPHTH-ACELL PERTUSSIS 5-2-15.5 LF-MCG/0.5 IM SUSP
0.50 | INTRAMUSCULAR | Status: DC
Start: ? — End: 2017-07-06

## 2017-07-06 MED ORDER — MORPHINE SULFATE (PF) 4 MG/ML IV SOLN
4.00 | INTRAVENOUS | Status: DC
Start: ? — End: 2017-07-06

## 2017-07-06 MED ORDER — ALUM & MAG HYDROXIDE-SIMETH 200-200-20 MG/5ML PO SUSP
30.00 | ORAL | Status: DC
Start: ? — End: 2017-07-06

## 2017-07-06 MED ORDER — PRENATAL 19 PO TABS
1.00 | ORAL_TABLET | ORAL | Status: DC
Start: 2017-07-06 — End: 2017-07-06

## 2017-07-06 MED ORDER — ACETAMINOPHEN 325 MG PO TABS
650.00 | ORAL_TABLET | ORAL | Status: DC
Start: ? — End: 2017-07-06

## 2017-07-06 MED ORDER — GENERIC EXTERNAL MEDICATION
Status: DC
Start: ? — End: 2017-07-06

## 2017-07-06 MED ORDER — OXYCODONE HCL 10 MG PO TABS
10.00 | ORAL_TABLET | ORAL | Status: DC
Start: ? — End: 2017-07-06

## 2017-07-06 MED ORDER — GUAIFENESIN 100 MG/5ML PO LIQD
200.00 | ORAL | Status: DC
Start: ? — End: 2017-07-06

## 2017-07-06 MED ORDER — SIMETHICONE 80 MG PO CHEW
80.00 | CHEWABLE_TABLET | ORAL | Status: DC
Start: ? — End: 2017-07-06

## 2017-07-06 MED ORDER — BENZOCAINE-MENTHOL 20-0.5 % EX AERO
INHALATION_SPRAY | CUTANEOUS | Status: DC
Start: ? — End: 2017-07-06

## 2017-07-06 MED ORDER — HYDROCODONE-ACETAMINOPHEN 5-325 MG PO TABS
1.00 | ORAL_TABLET | ORAL | Status: DC
Start: ? — End: 2017-07-06

## 2017-07-06 MED ORDER — BENZOCAINE-MENTHOL 15-3.6 MG MT LOZG
1.00 | LOZENGE | OROMUCOSAL | Status: DC
Start: ? — End: 2017-07-06

## 2017-07-06 MED ORDER — BISACODYL 10 MG RE SUPP
10.00 | RECTAL | Status: DC
Start: ? — End: 2017-07-06

## 2017-07-06 MED ORDER — MEASLES, MUMPS & RUBELLA VAC ~~LOC~~ INJ
0.50 | INJECTION | SUBCUTANEOUS | Status: DC
Start: ? — End: 2017-07-06

## 2017-07-06 MED ORDER — MAGNESIUM HYDROXIDE 400 MG/5ML PO SUSP
30.00 | ORAL | Status: DC
Start: ? — End: 2017-07-06

## 2017-07-06 MED ORDER — FERROUS SULFATE 325 (65 FE) MG PO TABS
325.00 | ORAL_TABLET | ORAL | Status: DC
Start: 2017-07-07 — End: 2017-07-06

## 2017-07-06 MED ORDER — LANOLIN EX OINT
TOPICAL_OINTMENT | CUTANEOUS | Status: DC
Start: ? — End: 2017-07-06

## 2017-07-06 MED ORDER — DOCUSATE SODIUM 100 MG PO CAPS
100.00 | ORAL_CAPSULE | ORAL | Status: DC
Start: 2017-07-06 — End: 2017-07-06

## 2017-07-06 MED ORDER — SALINE NASAL SPRAY 0.65 % NA SOLN
2.00 | NASAL | Status: DC
Start: ? — End: 2017-07-06

## 2017-07-06 MED ORDER — IBUPROFEN 800 MG PO TABS
800.00 | ORAL_TABLET | ORAL | Status: DC
Start: 2017-07-07 — End: 2017-07-06

## 2017-07-06 MED ORDER — MORPHINE SULFATE (PF) 10 MG/ML IV SOLN
10.00 | INTRAVENOUS | Status: DC
Start: ? — End: 2017-07-06

## 2017-07-06 MED ORDER — HYDROMORPHONE HCL 1 MG/ML IJ SOLN
1.00 | INTRAMUSCULAR | Status: DC
Start: ? — End: 2017-07-06

## 2017-07-06 MED ORDER — HYDROCODONE-ACETAMINOPHEN 10-325 MG PO TABS
1.00 | ORAL_TABLET | ORAL | Status: DC
Start: ? — End: 2017-07-06

## 2018-01-20 ENCOUNTER — Encounter (HOSPITAL_COMMUNITY): Payer: Self-pay

## 2018-06-27 IMAGING — US US OB TRANSVAGINAL
1 series · 14 of 28 positions shown · non-contrast
Comparison: None.

CLINICAL DATA: Bleeding for 1 day.

EXAM:
OBSTETRIC <14 WK US AND TRANSVAGINAL OB US
TECHNIQUE: Both transabdominal and transvaginal ultrasound examinations were
performed for complete evaluation of the gestation as well as the
maternal uterus, adnexal regions, and pelvic cul-de-sac.
Transvaginal technique was performed to assess early pregnancy.

[Series 1: us ob transvaginal · 0.19mm/px · 63 acquisitions, 14 frames shown]
[im 3/63]
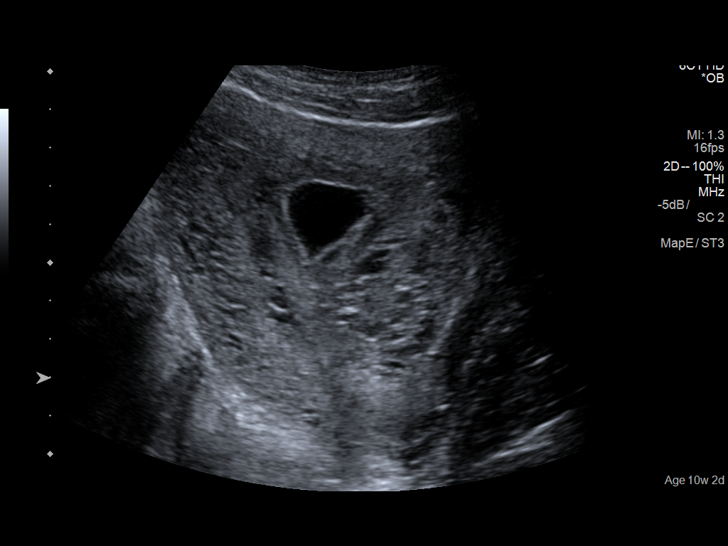
[im 7/63]
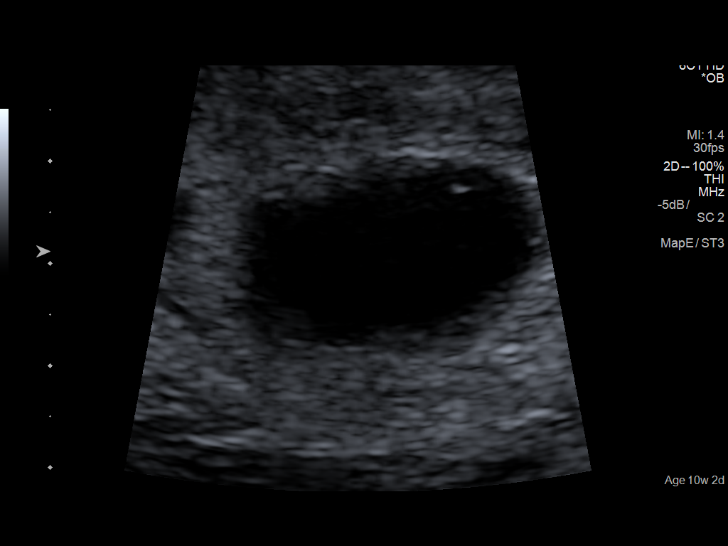
[im 12/63]
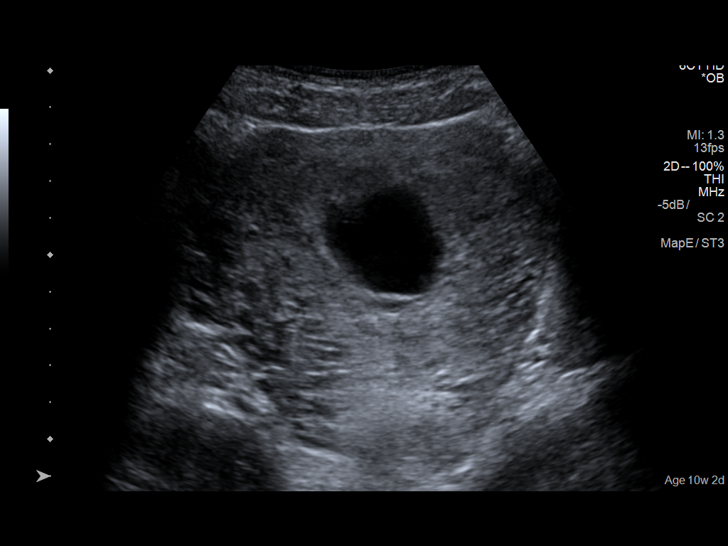
[im 17/63]
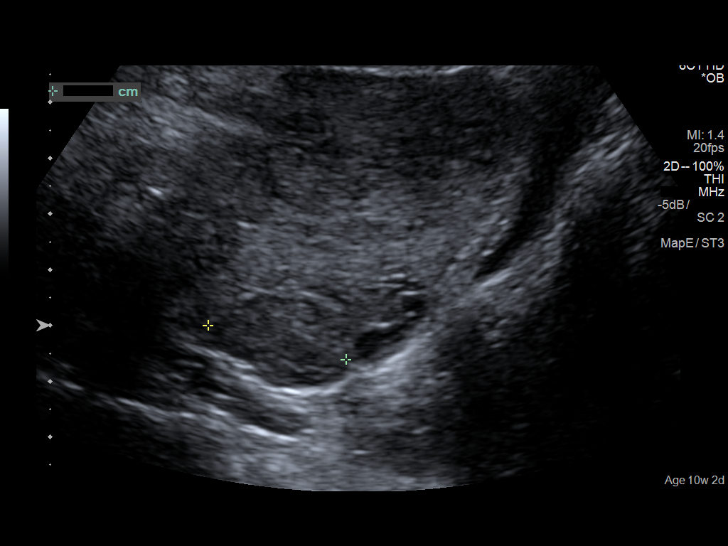
[im 21/63]
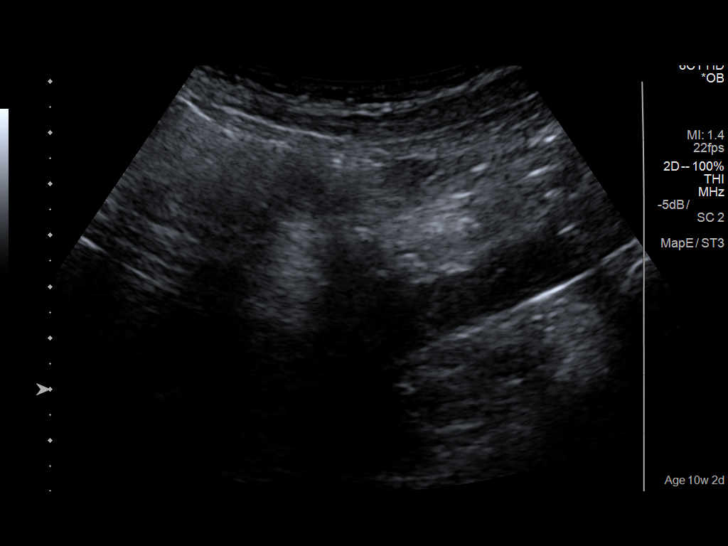
[im 26/63]
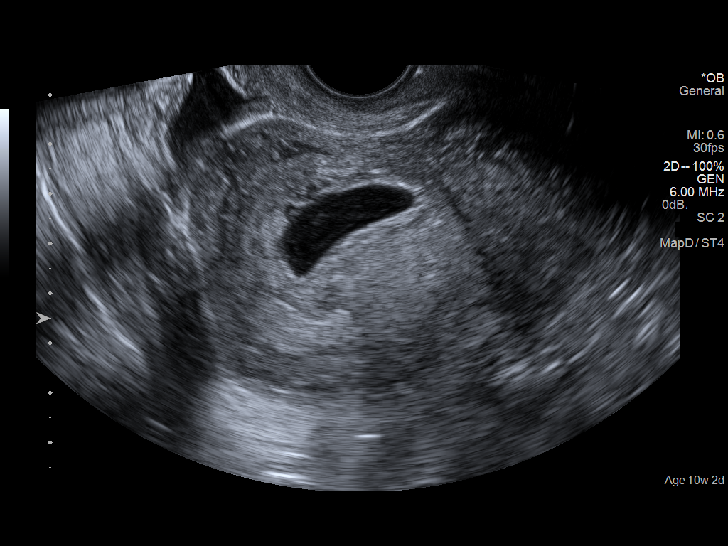
[im 30/63]
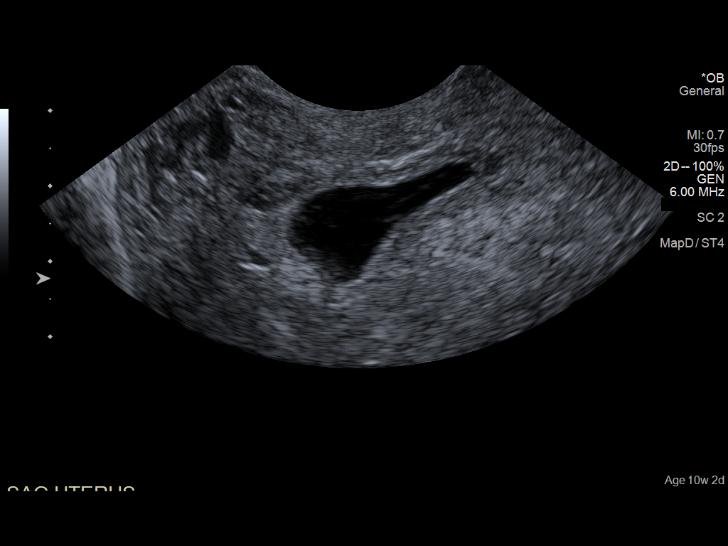
[im 35/63]
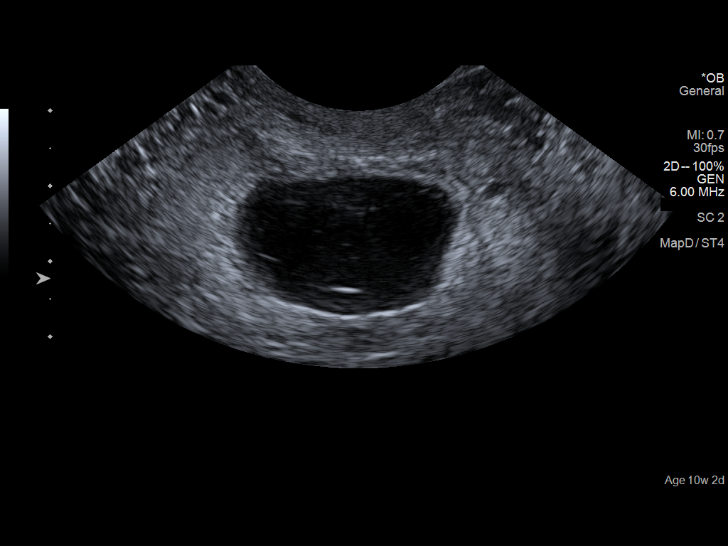
[im 40/63]
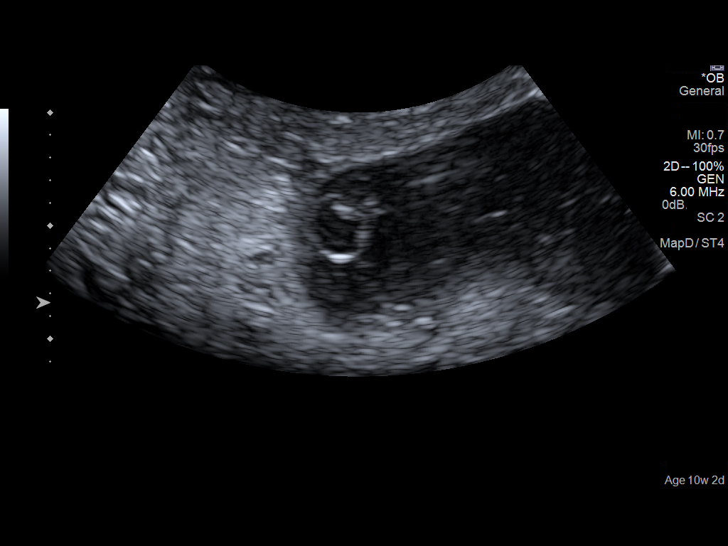
[im 44/63]
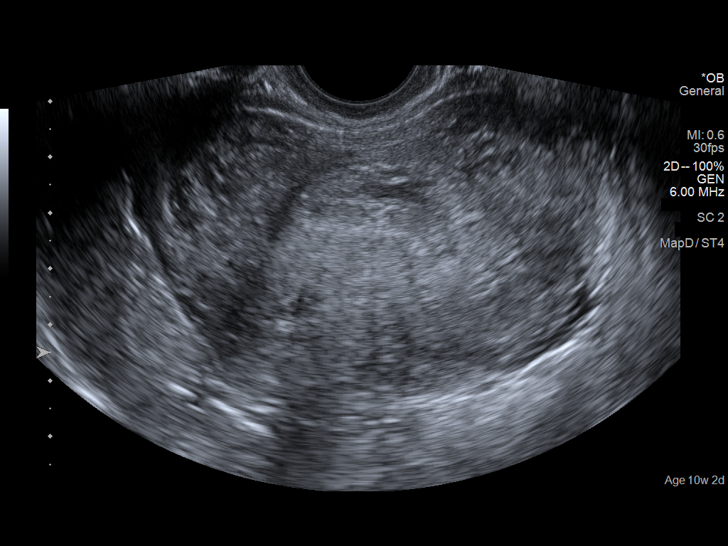
[im 49/63]
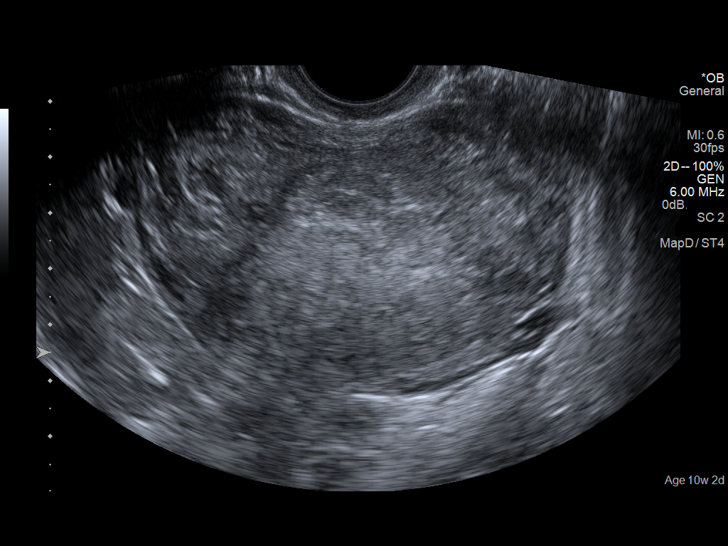
[im 53/63]
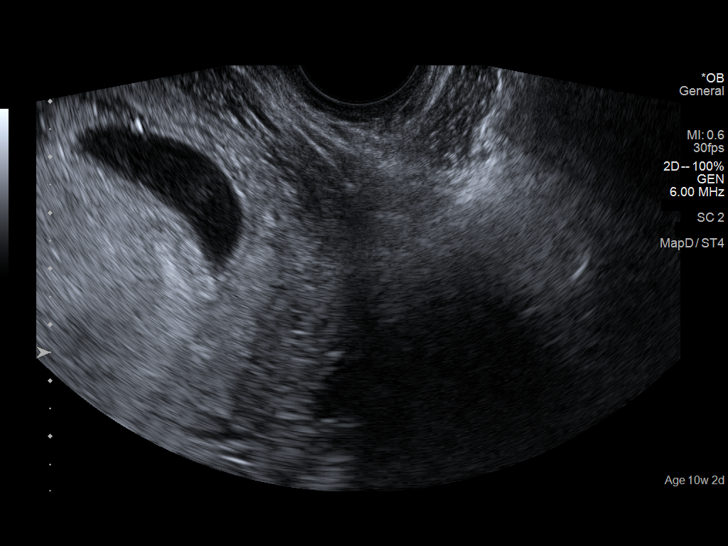
[im 58/63]
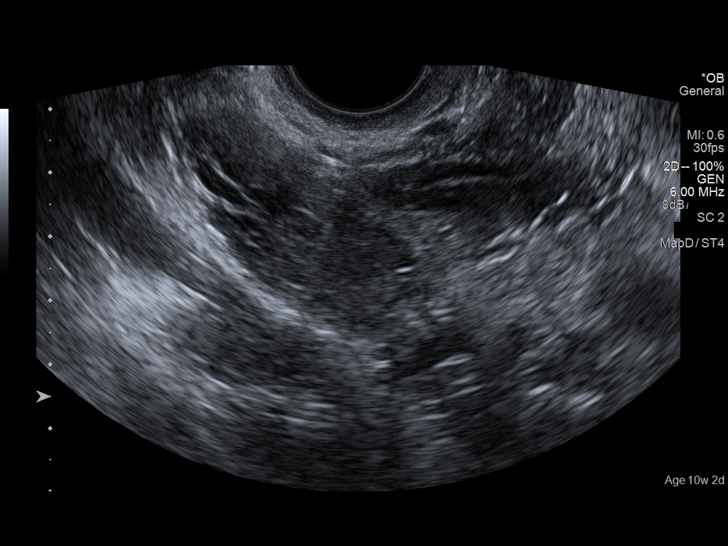
[im 63/63]
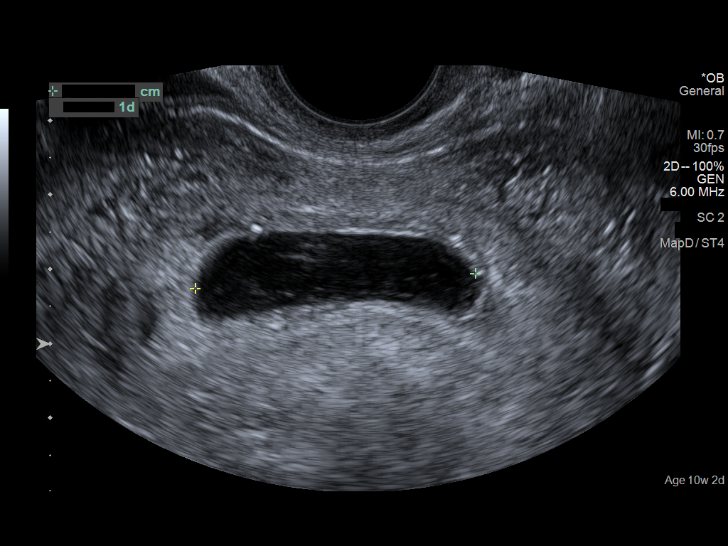

[14 of 28 positions shown; findings below may reference images not displayed]

FINDINGS: Intrauterine gestational sac: Single

Yolk sac:  Present

Embryo:  Not seen

Cardiac Activity: Not seen

Heart Rate: Not applicable

MSD: 25  mm   7 w   4  d

CRL:    mm    w    d                  US EDC:

Subchorionic hemorrhage:  None visualized.

Maternal uterus/adnexae: Maternal right ovary appears normal. Left
ovary is not seen but there is no mass or free fluid identified in
the left adnexal region.
IMPRESSION: Single intrauterine gestational sac. No embryo identified and there
is complex fluid within the gestational sac, highly suspicious for
pregnancy failure. Recommend follow-up with serial beta HCG levels
and pelvic ultrasound as needed.
